# Patient Record
Sex: Female | Born: 1965
Health system: Southern US, Community
[De-identification: ages and names within clinical notes are randomized; demographics above are authoritative.]

## PROBLEM LIST (undated history)

## (undated) DIAGNOSIS — N3281 Overactive bladder: Secondary | ICD-10-CM

## (undated) DIAGNOSIS — K219 Gastro-esophageal reflux disease without esophagitis: Secondary | ICD-10-CM

## (undated) DIAGNOSIS — M1990 Unspecified osteoarthritis, unspecified site: Secondary | ICD-10-CM

## (undated) DIAGNOSIS — R519 Headache, unspecified: Secondary | ICD-10-CM

## (undated) DIAGNOSIS — D649 Anemia, unspecified: Secondary | ICD-10-CM

## (undated) HISTORY — PX: SPINE SURGERY: SHX786

## (undated) HISTORY — PX: CHOLECYSTECTOMY: SHX55

## (undated) HISTORY — DX: Gastro-esophageal reflux disease without esophagitis: K21.9

## (undated) HISTORY — PX: ABDOMINAL HYSTERECTOMY: SHX81

---
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
## 2001-09-15 HISTORY — PX: ABDOMINAL HYSTERECTOMY: SHX81

## 2001-09-15 HISTORY — PX: CHOLECYSTECTOMY: SHX55

## 2011-09-16 HISTORY — PX: OTHER SURGICAL HISTORY: SHX169

## 2013-09-15 HISTORY — PX: INCONTINENCE SURGERY: SHX676

## 2015-08-02 DIAGNOSIS — M5412 Radiculopathy, cervical region: Secondary | ICD-10-CM | POA: Insufficient documentation

## 2016-01-02 DIAGNOSIS — M722 Plantar fascial fibromatosis: Secondary | ICD-10-CM | POA: Diagnosis not present

## 2016-01-02 DIAGNOSIS — Z1389 Encounter for screening for other disorder: Secondary | ICD-10-CM | POA: Diagnosis not present

## 2016-01-02 DIAGNOSIS — Z6828 Body mass index (BMI) 28.0-28.9, adult: Secondary | ICD-10-CM | POA: Diagnosis not present

## 2016-01-18 DIAGNOSIS — Z6827 Body mass index (BMI) 27.0-27.9, adult: Secondary | ICD-10-CM | POA: Diagnosis not present

## 2016-01-18 DIAGNOSIS — L237 Allergic contact dermatitis due to plants, except food: Secondary | ICD-10-CM | POA: Diagnosis not present

## 2016-02-01 DIAGNOSIS — M1612 Unilateral primary osteoarthritis, left hip: Secondary | ICD-10-CM | POA: Diagnosis not present

## 2016-02-01 DIAGNOSIS — M25552 Pain in left hip: Secondary | ICD-10-CM | POA: Diagnosis not present

## 2016-02-01 DIAGNOSIS — Z6829 Body mass index (BMI) 29.0-29.9, adult: Secondary | ICD-10-CM | POA: Diagnosis not present

## 2016-02-01 DIAGNOSIS — M5417 Radiculopathy, lumbosacral region: Secondary | ICD-10-CM | POA: Diagnosis not present

## 2016-02-26 DIAGNOSIS — M1612 Unilateral primary osteoarthritis, left hip: Secondary | ICD-10-CM | POA: Diagnosis not present

## 2016-02-26 DIAGNOSIS — M4726 Other spondylosis with radiculopathy, lumbar region: Secondary | ICD-10-CM | POA: Diagnosis not present

## 2016-02-26 DIAGNOSIS — M5417 Radiculopathy, lumbosacral region: Secondary | ICD-10-CM | POA: Diagnosis not present

## 2016-02-26 DIAGNOSIS — Z6829 Body mass index (BMI) 29.0-29.9, adult: Secondary | ICD-10-CM | POA: Diagnosis not present

## 2016-02-26 DIAGNOSIS — M5416 Radiculopathy, lumbar region: Secondary | ICD-10-CM | POA: Insufficient documentation

## 2016-03-07 DIAGNOSIS — M25552 Pain in left hip: Secondary | ICD-10-CM | POA: Diagnosis not present

## 2016-03-13 DIAGNOSIS — M5417 Radiculopathy, lumbosacral region: Secondary | ICD-10-CM | POA: Diagnosis not present

## 2016-03-13 DIAGNOSIS — M7138 Other bursal cyst, other site: Secondary | ICD-10-CM | POA: Diagnosis not present

## 2016-03-13 DIAGNOSIS — M545 Low back pain: Secondary | ICD-10-CM | POA: Diagnosis not present

## 2016-03-13 DIAGNOSIS — D1809 Hemangioma of other sites: Secondary | ICD-10-CM | POA: Diagnosis not present

## 2016-03-13 DIAGNOSIS — M5116 Intervertebral disc disorders with radiculopathy, lumbar region: Secondary | ICD-10-CM | POA: Diagnosis not present

## 2016-03-25 DIAGNOSIS — M1612 Unilateral primary osteoarthritis, left hip: Secondary | ICD-10-CM | POA: Diagnosis not present

## 2016-03-25 DIAGNOSIS — M4726 Other spondylosis with radiculopathy, lumbar region: Secondary | ICD-10-CM | POA: Diagnosis not present

## 2016-03-25 DIAGNOSIS — M5417 Radiculopathy, lumbosacral region: Secondary | ICD-10-CM | POA: Diagnosis not present

## 2016-03-25 DIAGNOSIS — Z6829 Body mass index (BMI) 29.0-29.9, adult: Secondary | ICD-10-CM | POA: Diagnosis not present

## 2016-03-27 DIAGNOSIS — Z6829 Body mass index (BMI) 29.0-29.9, adult: Secondary | ICD-10-CM | POA: Diagnosis not present

## 2016-03-27 DIAGNOSIS — M1612 Unilateral primary osteoarthritis, left hip: Secondary | ICD-10-CM | POA: Diagnosis not present

## 2016-04-10 DIAGNOSIS — M4726 Other spondylosis with radiculopathy, lumbar region: Secondary | ICD-10-CM | POA: Diagnosis not present

## 2016-04-10 DIAGNOSIS — Z6829 Body mass index (BMI) 29.0-29.9, adult: Secondary | ICD-10-CM | POA: Diagnosis not present

## 2016-04-10 DIAGNOSIS — M5417 Radiculopathy, lumbosacral region: Secondary | ICD-10-CM | POA: Diagnosis not present

## 2016-04-10 DIAGNOSIS — M1612 Unilateral primary osteoarthritis, left hip: Secondary | ICD-10-CM | POA: Diagnosis not present

## 2016-04-25 DIAGNOSIS — M5412 Radiculopathy, cervical region: Secondary | ICD-10-CM | POA: Diagnosis not present

## 2016-04-25 DIAGNOSIS — M9901 Segmental and somatic dysfunction of cervical region: Secondary | ICD-10-CM | POA: Diagnosis not present

## 2016-04-25 DIAGNOSIS — R51 Headache: Secondary | ICD-10-CM | POA: Diagnosis not present

## 2016-04-28 DIAGNOSIS — M546 Pain in thoracic spine: Secondary | ICD-10-CM | POA: Diagnosis not present

## 2016-04-28 DIAGNOSIS — R51 Headache: Secondary | ICD-10-CM | POA: Diagnosis not present

## 2016-04-28 DIAGNOSIS — M5412 Radiculopathy, cervical region: Secondary | ICD-10-CM | POA: Diagnosis not present

## 2016-04-28 DIAGNOSIS — M9901 Segmental and somatic dysfunction of cervical region: Secondary | ICD-10-CM | POA: Diagnosis not present

## 2016-04-30 DIAGNOSIS — M546 Pain in thoracic spine: Secondary | ICD-10-CM | POA: Diagnosis not present

## 2016-04-30 DIAGNOSIS — M9901 Segmental and somatic dysfunction of cervical region: Secondary | ICD-10-CM | POA: Diagnosis not present

## 2016-04-30 DIAGNOSIS — R51 Headache: Secondary | ICD-10-CM | POA: Diagnosis not present

## 2016-05-02 DIAGNOSIS — R51 Headache: Secondary | ICD-10-CM | POA: Diagnosis not present

## 2016-05-02 DIAGNOSIS — M9901 Segmental and somatic dysfunction of cervical region: Secondary | ICD-10-CM | POA: Diagnosis not present

## 2016-05-02 DIAGNOSIS — M546 Pain in thoracic spine: Secondary | ICD-10-CM | POA: Diagnosis not present

## 2016-05-05 DIAGNOSIS — M9901 Segmental and somatic dysfunction of cervical region: Secondary | ICD-10-CM | POA: Diagnosis not present

## 2016-05-05 DIAGNOSIS — M546 Pain in thoracic spine: Secondary | ICD-10-CM | POA: Diagnosis not present

## 2016-05-05 DIAGNOSIS — M5412 Radiculopathy, cervical region: Secondary | ICD-10-CM | POA: Diagnosis not present

## 2016-05-05 DIAGNOSIS — R51 Headache: Secondary | ICD-10-CM | POA: Diagnosis not present

## 2016-05-07 DIAGNOSIS — M9901 Segmental and somatic dysfunction of cervical region: Secondary | ICD-10-CM | POA: Diagnosis not present

## 2016-05-07 DIAGNOSIS — M546 Pain in thoracic spine: Secondary | ICD-10-CM | POA: Diagnosis not present

## 2016-05-07 DIAGNOSIS — R51 Headache: Secondary | ICD-10-CM | POA: Diagnosis not present

## 2016-05-08 DIAGNOSIS — N390 Urinary tract infection, site not specified: Secondary | ICD-10-CM | POA: Diagnosis not present

## 2016-05-12 DIAGNOSIS — Z6829 Body mass index (BMI) 29.0-29.9, adult: Secondary | ICD-10-CM | POA: Diagnosis not present

## 2016-05-12 DIAGNOSIS — M62838 Other muscle spasm: Secondary | ICD-10-CM | POA: Diagnosis not present

## 2016-05-12 DIAGNOSIS — M4726 Other spondylosis with radiculopathy, lumbar region: Secondary | ICD-10-CM | POA: Diagnosis not present

## 2016-05-12 DIAGNOSIS — M5417 Radiculopathy, lumbosacral region: Secondary | ICD-10-CM | POA: Diagnosis not present

## 2016-05-14 DIAGNOSIS — M5412 Radiculopathy, cervical region: Secondary | ICD-10-CM | POA: Diagnosis not present

## 2016-05-14 DIAGNOSIS — M546 Pain in thoracic spine: Secondary | ICD-10-CM | POA: Diagnosis not present

## 2016-05-14 DIAGNOSIS — M9901 Segmental and somatic dysfunction of cervical region: Secondary | ICD-10-CM | POA: Diagnosis not present

## 2016-05-21 DIAGNOSIS — R32 Unspecified urinary incontinence: Secondary | ICD-10-CM | POA: Diagnosis not present

## 2016-05-21 DIAGNOSIS — M9901 Segmental and somatic dysfunction of cervical region: Secondary | ICD-10-CM | POA: Diagnosis not present

## 2016-05-21 DIAGNOSIS — M546 Pain in thoracic spine: Secondary | ICD-10-CM | POA: Diagnosis not present

## 2016-05-21 DIAGNOSIS — N3281 Overactive bladder: Secondary | ICD-10-CM | POA: Diagnosis not present

## 2016-05-23 DIAGNOSIS — M5412 Radiculopathy, cervical region: Secondary | ICD-10-CM | POA: Diagnosis not present

## 2016-05-23 DIAGNOSIS — M546 Pain in thoracic spine: Secondary | ICD-10-CM | POA: Diagnosis not present

## 2016-05-23 DIAGNOSIS — R51 Headache: Secondary | ICD-10-CM | POA: Diagnosis not present

## 2016-05-23 DIAGNOSIS — M9901 Segmental and somatic dysfunction of cervical region: Secondary | ICD-10-CM | POA: Diagnosis not present

## 2016-05-26 DIAGNOSIS — M546 Pain in thoracic spine: Secondary | ICD-10-CM | POA: Diagnosis not present

## 2016-05-26 DIAGNOSIS — M5412 Radiculopathy, cervical region: Secondary | ICD-10-CM | POA: Diagnosis not present

## 2016-05-26 DIAGNOSIS — M9901 Segmental and somatic dysfunction of cervical region: Secondary | ICD-10-CM | POA: Diagnosis not present

## 2016-05-26 DIAGNOSIS — R51 Headache: Secondary | ICD-10-CM | POA: Diagnosis not present

## 2016-05-28 DIAGNOSIS — R51 Headache: Secondary | ICD-10-CM | POA: Diagnosis not present

## 2016-05-28 DIAGNOSIS — M9901 Segmental and somatic dysfunction of cervical region: Secondary | ICD-10-CM | POA: Diagnosis not present

## 2016-05-28 DIAGNOSIS — M546 Pain in thoracic spine: Secondary | ICD-10-CM | POA: Diagnosis not present

## 2016-05-28 DIAGNOSIS — M5412 Radiculopathy, cervical region: Secondary | ICD-10-CM | POA: Diagnosis not present

## 2016-05-30 DIAGNOSIS — Z6828 Body mass index (BMI) 28.0-28.9, adult: Secondary | ICD-10-CM | POA: Diagnosis not present

## 2016-05-30 DIAGNOSIS — K219 Gastro-esophageal reflux disease without esophagitis: Secondary | ICD-10-CM | POA: Diagnosis not present

## 2016-05-30 DIAGNOSIS — L309 Dermatitis, unspecified: Secondary | ICD-10-CM | POA: Diagnosis not present

## 2016-05-30 DIAGNOSIS — H6593 Unspecified nonsuppurative otitis media, bilateral: Secondary | ICD-10-CM | POA: Diagnosis not present

## 2016-06-28 DIAGNOSIS — S20212A Contusion of left front wall of thorax, initial encounter: Secondary | ICD-10-CM | POA: Diagnosis not present

## 2016-06-28 DIAGNOSIS — S299XXA Unspecified injury of thorax, initial encounter: Secondary | ICD-10-CM | POA: Diagnosis not present

## 2016-06-28 DIAGNOSIS — M542 Cervicalgia: Secondary | ICD-10-CM | POA: Diagnosis not present

## 2016-06-28 DIAGNOSIS — S161XXA Strain of muscle, fascia and tendon at neck level, initial encounter: Secondary | ICD-10-CM | POA: Diagnosis not present

## 2016-07-01 DIAGNOSIS — K210 Gastro-esophageal reflux disease with esophagitis: Secondary | ICD-10-CM | POA: Diagnosis not present

## 2016-07-01 DIAGNOSIS — R1310 Dysphagia, unspecified: Secondary | ICD-10-CM | POA: Diagnosis not present

## 2016-07-02 DIAGNOSIS — K2900 Acute gastritis without bleeding: Secondary | ICD-10-CM | POA: Diagnosis not present

## 2016-07-02 DIAGNOSIS — K228 Other specified diseases of esophagus: Secondary | ICD-10-CM | POA: Diagnosis not present

## 2016-07-02 DIAGNOSIS — K2950 Unspecified chronic gastritis without bleeding: Secondary | ICD-10-CM | POA: Diagnosis not present

## 2016-07-02 DIAGNOSIS — R12 Heartburn: Secondary | ICD-10-CM | POA: Diagnosis not present

## 2016-07-02 DIAGNOSIS — K210 Gastro-esophageal reflux disease with esophagitis: Secondary | ICD-10-CM | POA: Diagnosis not present

## 2016-07-02 DIAGNOSIS — R1310 Dysphagia, unspecified: Secondary | ICD-10-CM | POA: Diagnosis not present

## 2016-07-16 DIAGNOSIS — L309 Dermatitis, unspecified: Secondary | ICD-10-CM | POA: Diagnosis not present

## 2016-07-16 DIAGNOSIS — Z6829 Body mass index (BMI) 29.0-29.9, adult: Secondary | ICD-10-CM | POA: Diagnosis not present

## 2016-07-16 DIAGNOSIS — M545 Low back pain: Secondary | ICD-10-CM | POA: Diagnosis not present

## 2017-10-23 DIAGNOSIS — Z6830 Body mass index (BMI) 30.0-30.9, adult: Secondary | ICD-10-CM | POA: Diagnosis not present

## 2017-10-23 DIAGNOSIS — K219 Gastro-esophageal reflux disease without esophagitis: Secondary | ICD-10-CM | POA: Diagnosis not present

## 2017-10-23 DIAGNOSIS — J029 Acute pharyngitis, unspecified: Secondary | ICD-10-CM | POA: Diagnosis not present

## 2017-11-03 DIAGNOSIS — R32 Unspecified urinary incontinence: Secondary | ICD-10-CM | POA: Diagnosis not present

## 2017-11-03 DIAGNOSIS — N3281 Overactive bladder: Secondary | ICD-10-CM | POA: Diagnosis not present

## 2017-11-20 DIAGNOSIS — M722 Plantar fascial fibromatosis: Secondary | ICD-10-CM | POA: Diagnosis not present

## 2017-12-11 DIAGNOSIS — J3089 Other allergic rhinitis: Secondary | ICD-10-CM | POA: Diagnosis not present

## 2017-12-11 DIAGNOSIS — J3081 Allergic rhinitis due to animal (cat) (dog) hair and dander: Secondary | ICD-10-CM | POA: Diagnosis not present

## 2017-12-11 DIAGNOSIS — Z131 Encounter for screening for diabetes mellitus: Secondary | ICD-10-CM | POA: Diagnosis not present

## 2017-12-11 DIAGNOSIS — Z13220 Encounter for screening for lipoid disorders: Secondary | ICD-10-CM | POA: Diagnosis not present

## 2017-12-11 DIAGNOSIS — Z0000 Encounter for general adult medical examination without abnormal findings: Secondary | ICD-10-CM | POA: Diagnosis not present

## 2017-12-11 DIAGNOSIS — J301 Allergic rhinitis due to pollen: Secondary | ICD-10-CM | POA: Diagnosis not present

## 2017-12-11 DIAGNOSIS — Z1211 Encounter for screening for malignant neoplasm of colon: Secondary | ICD-10-CM | POA: Diagnosis not present

## 2017-12-18 DIAGNOSIS — N3281 Overactive bladder: Secondary | ICD-10-CM | POA: Diagnosis not present

## 2017-12-18 DIAGNOSIS — Z1231 Encounter for screening mammogram for malignant neoplasm of breast: Secondary | ICD-10-CM | POA: Diagnosis not present

## 2017-12-18 DIAGNOSIS — R32 Unspecified urinary incontinence: Secondary | ICD-10-CM | POA: Diagnosis not present

## 2018-01-08 DIAGNOSIS — Z6830 Body mass index (BMI) 30.0-30.9, adult: Secondary | ICD-10-CM | POA: Diagnosis not present

## 2018-01-08 DIAGNOSIS — R0602 Shortness of breath: Secondary | ICD-10-CM | POA: Diagnosis not present

## 2018-01-08 DIAGNOSIS — E785 Hyperlipidemia, unspecified: Secondary | ICD-10-CM | POA: Diagnosis not present

## 2018-01-08 DIAGNOSIS — R7301 Impaired fasting glucose: Secondary | ICD-10-CM | POA: Diagnosis not present

## 2018-01-12 DIAGNOSIS — R0602 Shortness of breath: Secondary | ICD-10-CM | POA: Diagnosis not present

## 2018-01-12 DIAGNOSIS — R05 Cough: Secondary | ICD-10-CM | POA: Diagnosis not present

## 2018-01-15 DIAGNOSIS — R32 Unspecified urinary incontinence: Secondary | ICD-10-CM | POA: Diagnosis not present

## 2018-01-15 DIAGNOSIS — N3281 Overactive bladder: Secondary | ICD-10-CM | POA: Diagnosis not present

## 2018-01-19 DIAGNOSIS — M722 Plantar fascial fibromatosis: Secondary | ICD-10-CM | POA: Diagnosis not present

## 2018-01-19 DIAGNOSIS — M25572 Pain in left ankle and joints of left foot: Secondary | ICD-10-CM | POA: Diagnosis not present

## 2018-01-22 DIAGNOSIS — Z6830 Body mass index (BMI) 30.0-30.9, adult: Secondary | ICD-10-CM | POA: Diagnosis not present

## 2018-01-22 DIAGNOSIS — Z7189 Other specified counseling: Secondary | ICD-10-CM | POA: Diagnosis not present

## 2018-01-22 DIAGNOSIS — Z1331 Encounter for screening for depression: Secondary | ICD-10-CM | POA: Diagnosis not present

## 2018-01-22 DIAGNOSIS — R0602 Shortness of breath: Secondary | ICD-10-CM | POA: Diagnosis not present

## 2018-01-29 DIAGNOSIS — M722 Plantar fascial fibromatosis: Secondary | ICD-10-CM | POA: Diagnosis not present

## 2018-01-29 DIAGNOSIS — M25572 Pain in left ankle and joints of left foot: Secondary | ICD-10-CM | POA: Diagnosis not present

## 2018-02-05 DIAGNOSIS — Z6830 Body mass index (BMI) 30.0-30.9, adult: Secondary | ICD-10-CM | POA: Diagnosis not present

## 2018-02-05 DIAGNOSIS — R0602 Shortness of breath: Secondary | ICD-10-CM | POA: Diagnosis not present

## 2018-02-10 DIAGNOSIS — M50323 Other cervical disc degeneration at C6-C7 level: Secondary | ICD-10-CM | POA: Diagnosis not present

## 2018-02-10 DIAGNOSIS — M546 Pain in thoracic spine: Secondary | ICD-10-CM | POA: Diagnosis not present

## 2018-02-10 DIAGNOSIS — M5441 Lumbago with sciatica, right side: Secondary | ICD-10-CM | POA: Diagnosis not present

## 2018-02-10 DIAGNOSIS — M5013 Cervical disc disorder with radiculopathy, cervicothoracic region: Secondary | ICD-10-CM | POA: Diagnosis not present

## 2018-02-15 DIAGNOSIS — M50323 Other cervical disc degeneration at C6-C7 level: Secondary | ICD-10-CM | POA: Diagnosis not present

## 2018-02-15 DIAGNOSIS — M5013 Cervical disc disorder with radiculopathy, cervicothoracic region: Secondary | ICD-10-CM | POA: Diagnosis not present

## 2018-02-15 DIAGNOSIS — M546 Pain in thoracic spine: Secondary | ICD-10-CM | POA: Diagnosis not present

## 2018-02-15 DIAGNOSIS — M5441 Lumbago with sciatica, right side: Secondary | ICD-10-CM | POA: Diagnosis not present

## 2018-02-22 DIAGNOSIS — M546 Pain in thoracic spine: Secondary | ICD-10-CM | POA: Diagnosis not present

## 2018-02-22 DIAGNOSIS — M5013 Cervical disc disorder with radiculopathy, cervicothoracic region: Secondary | ICD-10-CM | POA: Diagnosis not present

## 2018-02-22 DIAGNOSIS — M50323 Other cervical disc degeneration at C6-C7 level: Secondary | ICD-10-CM | POA: Diagnosis not present

## 2018-02-22 DIAGNOSIS — M5441 Lumbago with sciatica, right side: Secondary | ICD-10-CM | POA: Diagnosis not present

## 2018-02-24 DIAGNOSIS — M546 Pain in thoracic spine: Secondary | ICD-10-CM | POA: Diagnosis not present

## 2018-02-24 DIAGNOSIS — M5441 Lumbago with sciatica, right side: Secondary | ICD-10-CM | POA: Diagnosis not present

## 2018-02-24 DIAGNOSIS — M50323 Other cervical disc degeneration at C6-C7 level: Secondary | ICD-10-CM | POA: Diagnosis not present

## 2018-02-24 DIAGNOSIS — M5013 Cervical disc disorder with radiculopathy, cervicothoracic region: Secondary | ICD-10-CM | POA: Diagnosis not present

## 2018-02-25 DIAGNOSIS — M50323 Other cervical disc degeneration at C6-C7 level: Secondary | ICD-10-CM | POA: Diagnosis not present

## 2018-02-25 DIAGNOSIS — M5013 Cervical disc disorder with radiculopathy, cervicothoracic region: Secondary | ICD-10-CM | POA: Diagnosis not present

## 2018-02-25 DIAGNOSIS — M546 Pain in thoracic spine: Secondary | ICD-10-CM | POA: Diagnosis not present

## 2018-02-25 DIAGNOSIS — M5441 Lumbago with sciatica, right side: Secondary | ICD-10-CM | POA: Diagnosis not present

## 2018-03-03 DIAGNOSIS — M546 Pain in thoracic spine: Secondary | ICD-10-CM | POA: Diagnosis not present

## 2018-03-03 DIAGNOSIS — M5441 Lumbago with sciatica, right side: Secondary | ICD-10-CM | POA: Diagnosis not present

## 2018-03-03 DIAGNOSIS — M5013 Cervical disc disorder with radiculopathy, cervicothoracic region: Secondary | ICD-10-CM | POA: Diagnosis not present

## 2018-03-03 DIAGNOSIS — M50323 Other cervical disc degeneration at C6-C7 level: Secondary | ICD-10-CM | POA: Diagnosis not present

## 2018-03-08 DIAGNOSIS — M546 Pain in thoracic spine: Secondary | ICD-10-CM | POA: Diagnosis not present

## 2018-03-08 DIAGNOSIS — M5441 Lumbago with sciatica, right side: Secondary | ICD-10-CM | POA: Diagnosis not present

## 2018-03-08 DIAGNOSIS — M50323 Other cervical disc degeneration at C6-C7 level: Secondary | ICD-10-CM | POA: Diagnosis not present

## 2018-03-08 DIAGNOSIS — M5013 Cervical disc disorder with radiculopathy, cervicothoracic region: Secondary | ICD-10-CM | POA: Diagnosis not present

## 2018-03-10 DIAGNOSIS — M546 Pain in thoracic spine: Secondary | ICD-10-CM | POA: Diagnosis not present

## 2018-03-10 DIAGNOSIS — M50323 Other cervical disc degeneration at C6-C7 level: Secondary | ICD-10-CM | POA: Diagnosis not present

## 2018-03-10 DIAGNOSIS — M5013 Cervical disc disorder with radiculopathy, cervicothoracic region: Secondary | ICD-10-CM | POA: Diagnosis not present

## 2018-03-10 DIAGNOSIS — M5441 Lumbago with sciatica, right side: Secondary | ICD-10-CM | POA: Diagnosis not present

## 2018-03-11 DIAGNOSIS — M546 Pain in thoracic spine: Secondary | ICD-10-CM | POA: Diagnosis not present

## 2018-03-11 DIAGNOSIS — M5441 Lumbago with sciatica, right side: Secondary | ICD-10-CM | POA: Diagnosis not present

## 2018-03-11 DIAGNOSIS — M5013 Cervical disc disorder with radiculopathy, cervicothoracic region: Secondary | ICD-10-CM | POA: Diagnosis not present

## 2018-03-11 DIAGNOSIS — M50323 Other cervical disc degeneration at C6-C7 level: Secondary | ICD-10-CM | POA: Diagnosis not present

## 2018-03-15 DIAGNOSIS — M50323 Other cervical disc degeneration at C6-C7 level: Secondary | ICD-10-CM | POA: Diagnosis not present

## 2018-03-15 DIAGNOSIS — M5441 Lumbago with sciatica, right side: Secondary | ICD-10-CM | POA: Diagnosis not present

## 2018-03-15 DIAGNOSIS — M546 Pain in thoracic spine: Secondary | ICD-10-CM | POA: Diagnosis not present

## 2018-03-15 DIAGNOSIS — M5013 Cervical disc disorder with radiculopathy, cervicothoracic region: Secondary | ICD-10-CM | POA: Diagnosis not present

## 2018-03-16 DIAGNOSIS — M5013 Cervical disc disorder with radiculopathy, cervicothoracic region: Secondary | ICD-10-CM | POA: Diagnosis not present

## 2018-03-16 DIAGNOSIS — M5441 Lumbago with sciatica, right side: Secondary | ICD-10-CM | POA: Diagnosis not present

## 2018-03-16 DIAGNOSIS — M50323 Other cervical disc degeneration at C6-C7 level: Secondary | ICD-10-CM | POA: Diagnosis not present

## 2018-03-16 DIAGNOSIS — M546 Pain in thoracic spine: Secondary | ICD-10-CM | POA: Diagnosis not present

## 2018-04-19 DIAGNOSIS — M545 Low back pain: Secondary | ICD-10-CM | POA: Diagnosis not present

## 2018-04-19 DIAGNOSIS — Z7952 Long term (current) use of systemic steroids: Secondary | ICD-10-CM | POA: Diagnosis not present

## 2018-04-19 DIAGNOSIS — Z79899 Other long term (current) drug therapy: Secondary | ICD-10-CM | POA: Diagnosis not present

## 2018-04-19 DIAGNOSIS — M549 Dorsalgia, unspecified: Secondary | ICD-10-CM | POA: Diagnosis not present

## 2018-04-19 DIAGNOSIS — M4696 Unspecified inflammatory spondylopathy, lumbar region: Secondary | ICD-10-CM | POA: Diagnosis not present

## 2018-04-19 DIAGNOSIS — M47816 Spondylosis without myelopathy or radiculopathy, lumbar region: Secondary | ICD-10-CM | POA: Diagnosis not present

## 2018-04-19 DIAGNOSIS — R102 Pelvic and perineal pain: Secondary | ICD-10-CM | POA: Diagnosis not present

## 2018-04-19 DIAGNOSIS — Z79891 Long term (current) use of opiate analgesic: Secondary | ICD-10-CM | POA: Diagnosis not present

## 2018-05-06 DIAGNOSIS — M545 Low back pain: Secondary | ICD-10-CM | POA: Diagnosis not present

## 2018-05-12 DIAGNOSIS — M160 Bilateral primary osteoarthritis of hip: Secondary | ICD-10-CM | POA: Diagnosis not present

## 2018-05-12 DIAGNOSIS — M25552 Pain in left hip: Secondary | ICD-10-CM | POA: Diagnosis not present

## 2018-05-13 DIAGNOSIS — M545 Low back pain: Secondary | ICD-10-CM | POA: Diagnosis not present

## 2018-05-21 DIAGNOSIS — M1612 Unilateral primary osteoarthritis, left hip: Secondary | ICD-10-CM | POA: Diagnosis not present

## 2018-05-26 DIAGNOSIS — N3281 Overactive bladder: Secondary | ICD-10-CM | POA: Diagnosis not present

## 2018-09-13 DIAGNOSIS — N390 Urinary tract infection, site not specified: Secondary | ICD-10-CM | POA: Diagnosis not present

## 2018-09-13 DIAGNOSIS — N3281 Overactive bladder: Secondary | ICD-10-CM | POA: Diagnosis not present

## 2019-12-14 ENCOUNTER — Encounter: Payer: Self-pay | Admitting: Physical Medicine and Rehabilitation

## 2019-12-30 ENCOUNTER — Encounter: Payer: 59 | Attending: Physical Medicine and Rehabilitation | Admitting: Physical Medicine and Rehabilitation

## 2019-12-30 ENCOUNTER — Other Ambulatory Visit: Payer: Self-pay

## 2019-12-30 ENCOUNTER — Encounter: Payer: Self-pay | Admitting: Physical Medicine and Rehabilitation

## 2019-12-30 VITALS — BP 149/84 | HR 66 | Temp 97.7°F | Ht 60.0 in | Wt 161.0 lb

## 2019-12-30 DIAGNOSIS — M25551 Pain in right hip: Secondary | ICD-10-CM | POA: Diagnosis not present

## 2019-12-30 DIAGNOSIS — M79672 Pain in left foot: Secondary | ICD-10-CM | POA: Diagnosis not present

## 2019-12-30 DIAGNOSIS — M1652 Unilateral post-traumatic osteoarthritis, left hip: Secondary | ICD-10-CM

## 2019-12-30 DIAGNOSIS — M5416 Radiculopathy, lumbar region: Secondary | ICD-10-CM | POA: Insufficient documentation

## 2019-12-30 MED ORDER — DIAZEPAM 5 MG PO TABS: 5.0000 mg | ORAL_TABLET | Freq: Once | ORAL | 0 refills | Status: AC

## 2019-12-30 MED ORDER — DULOXETINE HCL 30 MG PO CPEP: 30.0000 mg | ORAL_CAPSULE | Freq: Every day | ORAL | 3 refills | Status: DC

## 2019-12-30 NOTE — Patient Instructions (Signed)
1. Need to get xray of L hip  From what pt says, is bone on bone and needs THR.   2. Podiatry referral for L foot pain  3. MRI of lumbar spine due to lumbar radiculopathy and urinary incontinence  4. R hip MRI since has gotten xray at OSH and undergone R trochanteric bursa injection with no improvement- checking on labrum and cartilage.  5. Valium 5 mg x1 for MRI can repeat x1- #2 given . 6.  Duloxetine /Cymbalta 30 mg nightly x 1 week  Then 60 mg nightly- for nerve pain  1% of patients can have nausea with Duloxetine- call me if needs an anti-nausea medicine. Can also cause mild dry mouth/dry eyes and mild constipation.  7. Will look into pain meds once gets results of MRIs, xray back.   8. Once results come back- will call pt to go over all results. And go ver options for pain control, etc.    9. Tennis balls- 2-4 minutes- firm pressure against a harder surface- no massage- use as frequently as daily- also throw 1 in car.   10. F/U in 4-6 weeks.

## 2019-12-30 NOTE — Progress Notes (Signed)
Subjective:    Patient ID: Donna [name], female    DOB: Dec 02, 1965, 54 y.o.   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    The "strongest" medicine has received is ibuprofen 800 mg -   Pain medicine makes her feel "sick" /nauseated, so takes at night.    Around xmas- did "splits" and went down and fell on R hip.  Got trochanteric bursa injection last week. On 4/7- didn't help at all- not even for a short period.    Was given Lortab, Percocet and Loricet- mainly helping her fall asleep, but was still having pain when woke up. In remote past.    Wakes up every 1-2 hours- because can lay on 1 hip for 1 hour at a time.  If lays on back, hurt really bad when wakes up. Uses pole from bed to pull herself into sitting position or to turn over.  Did xrays at their office.  Last week.  Had MRI of L hip 2 years ago. Was told needed L hip replacement.    Back pain- burning, sharp pain in low back- in band across low back Radiates down R leg- sometimes to the feet.   L knee tries to "lock up on her"- and thinks all pain is related- the hips, back and L knee.   Last fall was 12/25- raining and muddy- and leg went out from her. Not since then.   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  Puts more weight on R hip and RLE due to L hip problems Vacuuming, sweeping, toting stuff, bending over to lift.  Washing dishes also makes it worse.  Bending forward is much worse than back extension   Tried: Never tried nerve pain meds Tried muscle relaxants- not helpful.  Steroids- have tried short term- steroid taper;  didn't help back pain    L foot goes "out" and "sinks" in- medial aspect of L foot    Social Hx:  Husband passed away in 01/17/18. 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  Tried to get disability- was turned down- tried 2020 for the last time.        Pain Inventory Average Pain 9 Pain Right Now 9 My pain is sharp, burning and aching  In the last 24 hours, has pain interfered with the following? General activity 6 Relation with others 7 Enjoyment of life 7 What TIME of day is your pain at its worst? all Sleep (in general) Poor  Pain is worse with: walking, bending, sitting and standing Pain improves with: none Relief from Meds: 0  Mobility walk without assistance do you drive?  yes Do you have any goals in this area?  yes  Function employed # of hrs/week 40  Neuro/Psych bladder control problems  Prior Studies new  Physicians involved in your care new   Family History  Problem Relation Age of Onset  . Heart disease Mother   . Cancer Father   . Heart disease Father    Social History   Socioeconomic History  . Marital status: Unknown    Spouse name: Not on file  . Number of children: Not on file  . Years of education: Not on file  . Highest education level: Not  on file  Occupational History  . Not on file  Tobacco Use  . Smoking status: Former Research scientist (life sciences)  . Smokeless tobacco: Never Used  Substance and Sexual Activity  . Alcohol use: Not on file  . Drug use: Not on file  . Sexual activity: Not on file  Other Topics Concern  . Not on file  Social History Narrative  . Not on file   Social Determinants of Health   Financial Resource Strain:   . Difficulty of Paying Living Expenses:   Food Insecurity:   . Worried About Charity fundraiser in the Last Year:   . Arboriculturist in the Last Year:   Transportation Needs:   . Film/video editor (Medical):   Marland Kitchen Lack of Transportation (Non-Medical):   Physical Activity:   . Days of Exercise per Week:   . Minutes of Exercise per Session:   Stress:   . Feeling of Stress :   Social Connections:   . Frequency of Communication with Friends and Family:    . Frequency of Social Gatherings with Friends and Family:   . Attends Religious Services:   . Active Member of Clubs or Organizations:   . Attends Archivist Meetings:   Marland Kitchen Marital Status:     Past Medical History:  Diagnosis Date  . GERD (gastroesophageal reflux disease)    BP (!) 149/84   Pulse 66   Temp 97.7 F (36.5 C)   Ht 5' (1.524 m)   Wt 161 lb (73 kg)   SpO2 96%   BMI 31.44 kg/m   Opioid Risk Score:   Fall Risk Score:  `1  Depression screen PHQ 2/9  Depression screen PHQ 2/9 12/30/2019  Decreased Interest 1  Down, Depressed, Hopeless 0  PHQ - 2 Score 1  Altered sleeping 3  Tired, decreased energy 1  Change in appetite 0  Feeling bad or failure about yourself  0  Trouble concentrating 0  Moving slowly or fidgety/restless 2  Suicidal thoughts 0  PHQ-9 Score 7    Review of Systems  Constitutional: Negative.   HENT: Negative.   Respiratory: Negative.   Cardiovascular: Negative.   Gastrointestinal: Negative.   Genitourinary: Positive for difficulty urinating.  Musculoskeletal: Positive for arthralgias and back pain.  Skin: Negative.   Allergic/Immunologic: Negative.   Neurological: Negative.   Psychiatric/Behavioral: Negative.   All other systems reviewed and are negative.      Objective:   Physical Exam Awake, alert, appropriate,  Sitting on table, NAD MS:  UEs 5/5 in deltoid, biceps, triceps, WE, grip and finger abd B/L HF, KE, KF, DF and PF 5/5 in LEs NO groin pain/TTP on R- only L TTP over R lateral trochanteric bursa and also as well on L lateral trochanteric bursa as well.  Lumbar extension/rotation doesn't cause pain on R or L Lumbar flexion causes increased lumbar pain TTP over midline lower lumbar as well as lumbar paraspinals B/L   Neuro: Intact to light touch  In UEs and LEs B/L- on every dermatome   L foot medial aspect of foot is curved inwards, not "straight"-     these showed degenerative disc disease at L5-S1  with spondylosis. 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   1. Need to get xray of L hip  From what pt says, is bone on  bone and needs THR.   2. Podiatry referral for L foot pain  3. MRI of lumbar spine due to lumbar radiculopathy and urinary incontinence  4. R hip MRI since has gotten xray at OSH and undergone R trochanteric bursa injection with no improvement- checking on labrum and cartilage.  5. Valium 5 mg x1 for MRI can repeat x1- #2 given . 6.  Duloxetine /Cymbalta 30 mg nightly x 1 week  Then 60 mg nightly- for nerve pain  1% of patients can have nausea with Duloxetine- call me if needs an anti-nausea medicine. Can also cause mild dry mouth/dry eyes and mild constipation.  7. Will look into pain meds once gets results of MRIs, xray back.   8. Once results come back- will call pt to go over all results. And go ver options for pain control, etc.    9. Tennis balls- 2-4 minutes- firm pressure against a harder surface- no massage- use as frequently as daily- also throw 1 in car.   10. F/U in 4-6 weeks.   I spent a total of 50 minutes on appointment- more than 30 minutes discussing options as detailed above.

## 2020-01-02 ENCOUNTER — Other Ambulatory Visit (HOSPITAL_BASED_OUTPATIENT_CLINIC_OR_DEPARTMENT_OTHER): Payer: Self-pay | Admitting: Physical Medicine and Rehabilitation

## 2020-01-03 ENCOUNTER — Telehealth: Payer: Self-pay | Admitting: *Deleted

## 2020-01-03 NOTE — Telephone Encounter (Signed)
Donna [name] left a message on clinical [name] "medication is duloxetine" but did not say what this was inrefernce to. I have called her back and asked for a call back with detail of what she was trying to say.

## 2020-01-05 NOTE — Telephone Encounter (Signed)
I don't understand what's being asked for here- please let me know. thanks

## 2020-01-06 NOTE — Telephone Encounter (Signed)
Got real nauseated from Duloxetine and also "got a headache" from it as well.  Asked her to not take anymore and will con't current regimen otherwise until get MRI results back.

## 2020-01-07 ENCOUNTER — Ambulatory Visit (HOSPITAL_BASED_OUTPATIENT_CLINIC_OR_DEPARTMENT_OTHER)
Admission: RE | Admit: 2020-01-07 | Discharge: 2020-01-07 | Disposition: A | Discharge status: Home / Self Care | Payer: 59 | Source: Ambulatory Visit | Attending: Physical Medicine and Rehabilitation | Admitting: Physical Medicine and Rehabilitation

## 2020-01-07 ENCOUNTER — Other Ambulatory Visit: Payer: Self-pay

## 2020-01-07 DIAGNOSIS — M1652 Unilateral post-traumatic osteoarthritis, left hip: Secondary | ICD-10-CM | POA: Insufficient documentation

## 2020-01-07 DIAGNOSIS — M5416 Radiculopathy, lumbar region: Secondary | ICD-10-CM | POA: Diagnosis present

## 2020-01-07 DIAGNOSIS — M25551 Pain in right hip: Secondary | ICD-10-CM | POA: Insufficient documentation

## 2020-01-07 IMAGING — MR MR HIP*R* W/O CM
7 series · 40 of 40 positions shown · non-contrast
Comparison: Bilateral hip x-rays dated [DATE]. MRI left hip
dated [DATE].

CLINICAL DATA: Persistent right hip pain since fall last [REDACTED].
No prior surgery.

EXAM:
MR OF THE RIGHT HIP WITHOUT CONTRAST
TECHNIQUE: Multiplanar, multisequence MR imaging was performed. No intravenous
contrast was administered.

[Series 6: T1 · coronal · 4.0mm · 1.33mm/px · 6 of 34 slices shown]
[im 1/34]
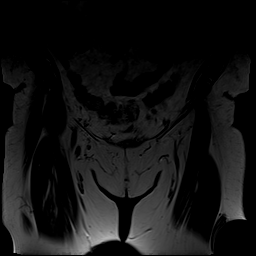
[im 7/34]
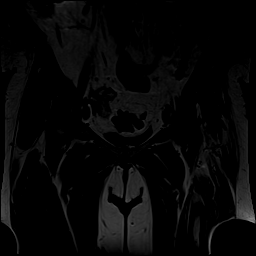
[im 14/34]
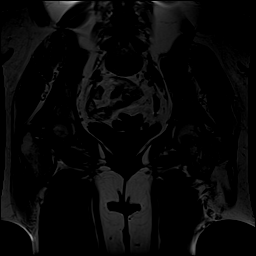
[im 20/34]
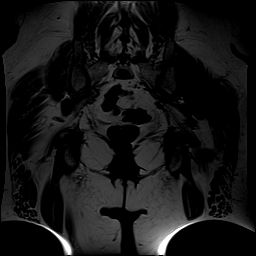
[im 27/34]
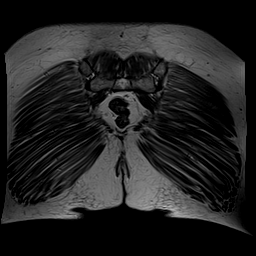
[im 34/34]
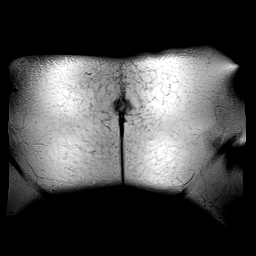

[Series 7: STIR · coronal · 4.0mm · 1.33mm/px · 7 of 34 slices shown]
[im 1/34]
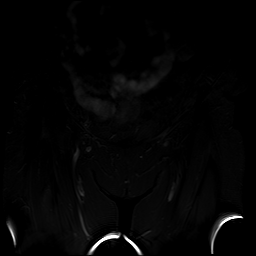
[im 6/34]
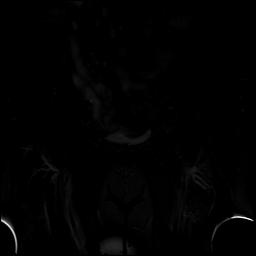
[im 12/34]
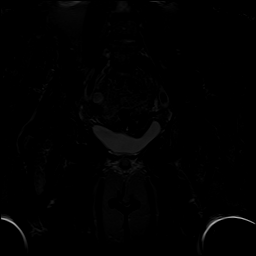
[im 17/34]
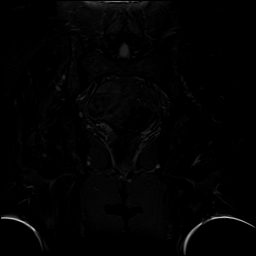
[im 23/34]
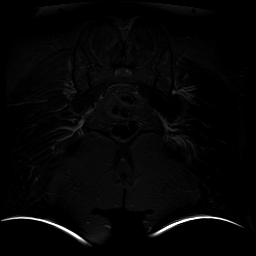
[im 28/34]
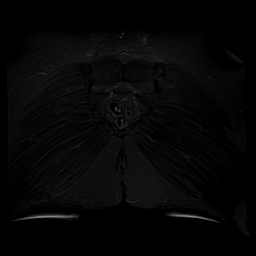
[im 34/34]
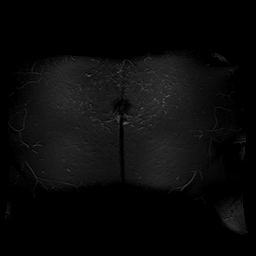

[Series 8: T2 fat-sat · axial · 4.0mm · 0.70mm/px · z∈[-213,-88]mm · 6 of 26 slices shown]
[im 1/26]
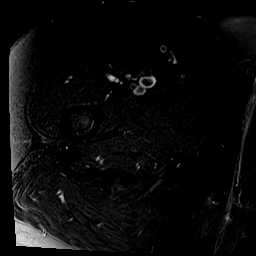
[im 6/26]
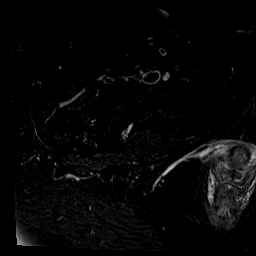
[im 11/26]
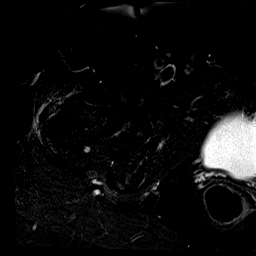
[im 16/26]
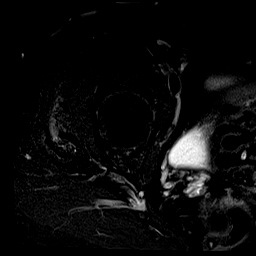
[im 21/26]
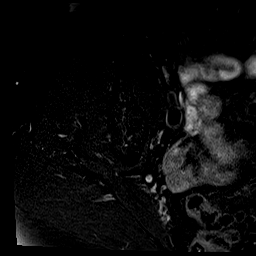
[im 26/26]
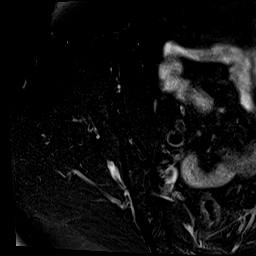

[Series 9: PD fat-sat · sagittal · 4.0mm · 0.70mm/px · 6 of 26 slices shown (1 of 3)]
[im 1/26]
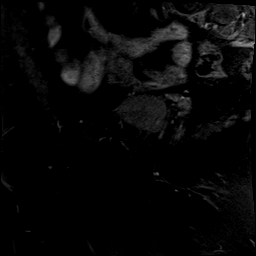
[im 6/26]
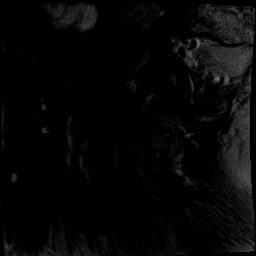
[im 11/26]
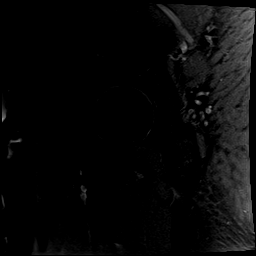
[im 16/26]
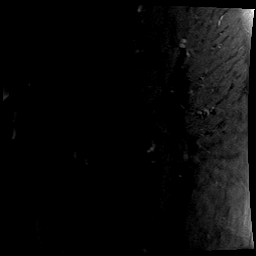
[im 21/26]
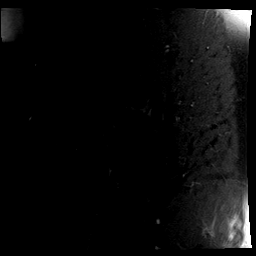
[im 26/26]
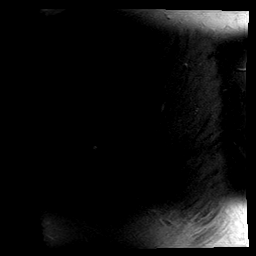

[Series 10: PD fat-sat · coronal · 4.0mm · 0.70mm/px · 5 of 22 slices shown (2 of 3)]
[im 1/22]
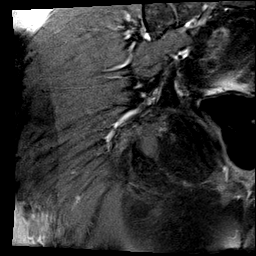
[im 6/22]
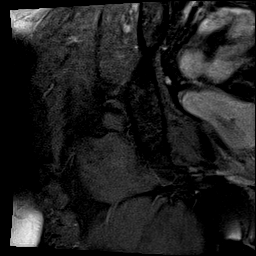
[im 11/22]
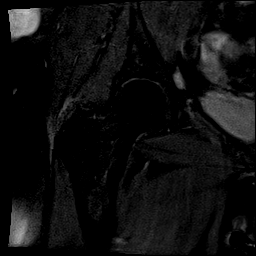
[im 16/22]
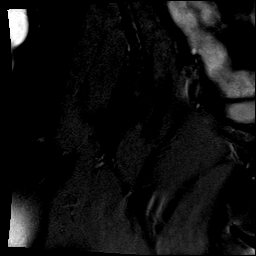
[im 22/22]
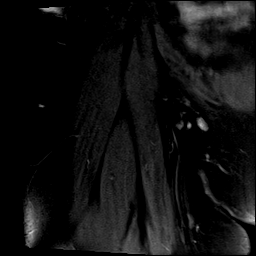

[Series 11: PD fat-sat · coronal · 4.0mm · 0.70mm/px · 5 of 22 slices shown (3 of 3)]
[im 1/22]
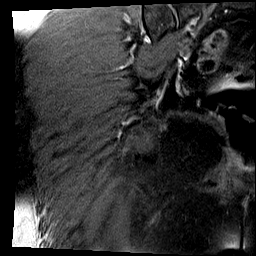
[im 6/22]
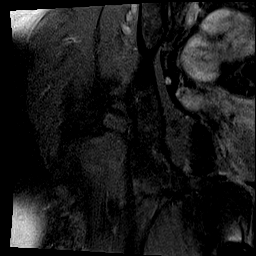
[im 11/22]
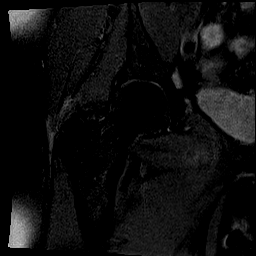
[im 16/22]
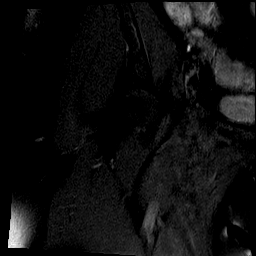
[im 22/22]
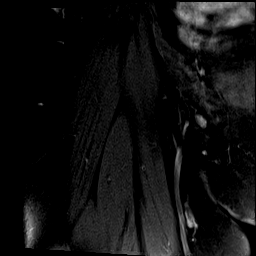

[Series 101: hx rt hip · axial · 10.0mm · 0.78mm/px · z∈[-236,+92]mm · 5 of 21 slices shown]
[im 1/21]
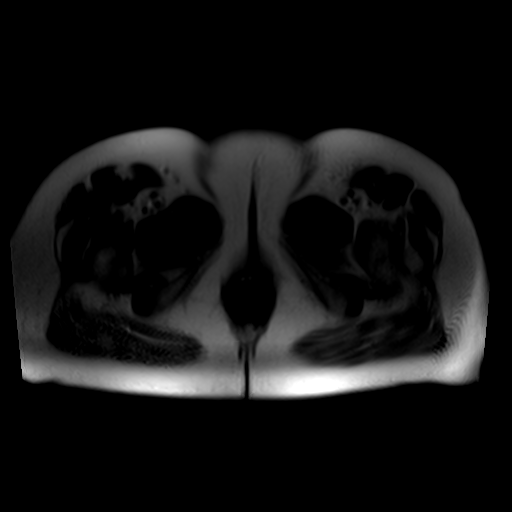
[im 6/21]
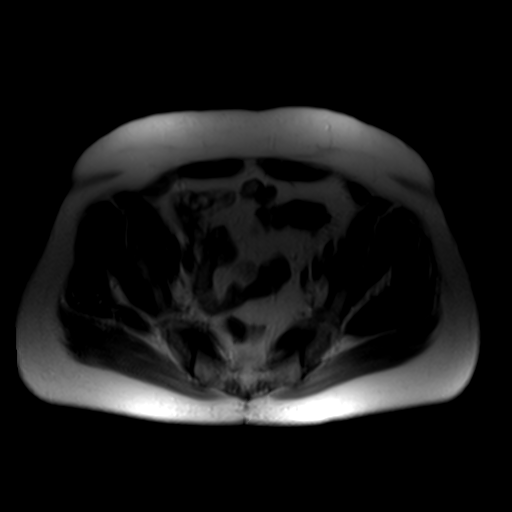
[im 11/21]
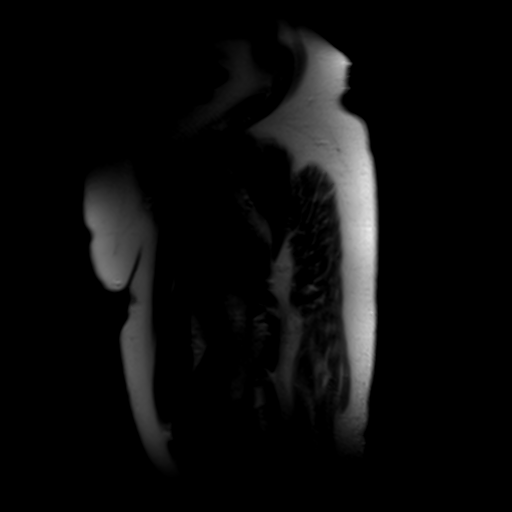
[im 16/21]
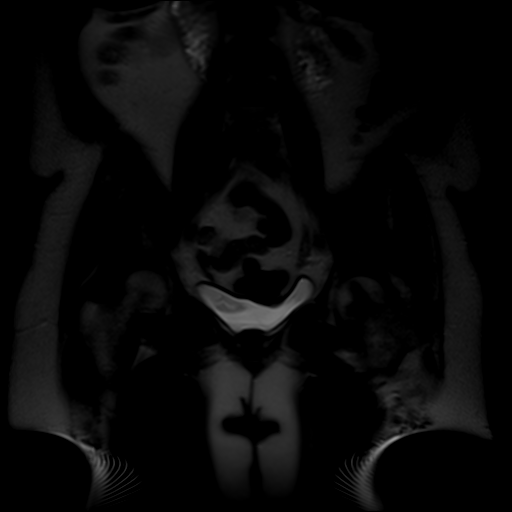
[im 21/21]
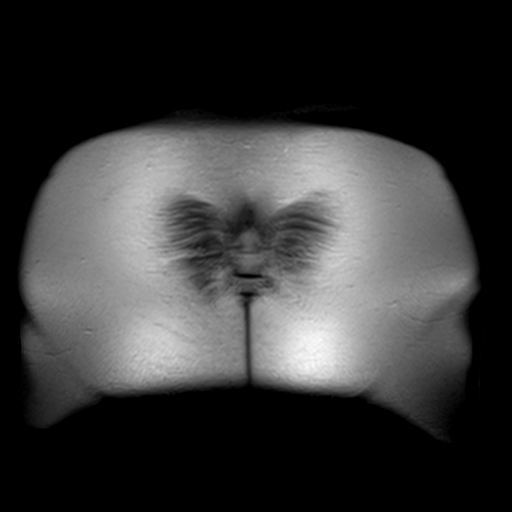

[40 of 40 positions shown; findings below may reference images not displayed]

FINDINGS: Bones: There is no evidence of acute fracture, dislocation or
avascular necrosis. No focal bone lesion. Chronic deformity and
postsurgical changes of the left intertrochanteric femur. The
visualized sacroiliac joints and symphysis pubis appear normal.

Articular cartilage and labrum

Articular cartilage: Unchanged mild diffuse cartilage thinning of
both hip joints without focal defect. Unchanged small bilateral hip
marginal osteophytes. No subchondral signal abnormality identified.

Labrum: Grossly intact, although evaluation is limited due to lack
of intra-articular fluid. No paralabral abnormality.

Joint or bursal effusion

Joint effusion: No significant hip joint effusion.

Bursae: No focal periarticular fluid collection.

Muscles and tendons

Muscles and tendons: Right gluteus minimus and medius tendinosis
with tiny partial tear of the gluteus medius (series 8, image 13).
Associated peritendinous edema. The visualized hamstring, iliopsoas,
and left gluteus tendons appear normal. No muscle edema. Severe
atrophy of the left proximal vastus lateralis muscle again noted.

Other findings

Miscellaneous: Prior hysterectomy. Sigmoid diverticulosis.
IMPRESSION: 1. No acute abnormality.
2. Right gluteus minimus and medius tendinosis with tiny partial
tear of the gluteus medius.
3. Unchanged mild bilateral hip osteoarthritis.
4. Chronic deformity and postsurgical changes of the left
intertrochanteric femur.

## 2020-01-07 IMAGING — DX DG HIP (WITH OR WITHOUT PELVIS) 3-4V BILAT
5 series · 5 of 5 positions shown · non-contrast
Comparison: [DATE]

CLINICAL DATA: Left hip pain, osteoarthritis

EXAM:
DG HIP (WITH OR WITHOUT PELVIS) 3-4V BILAT

[pelvis ap]
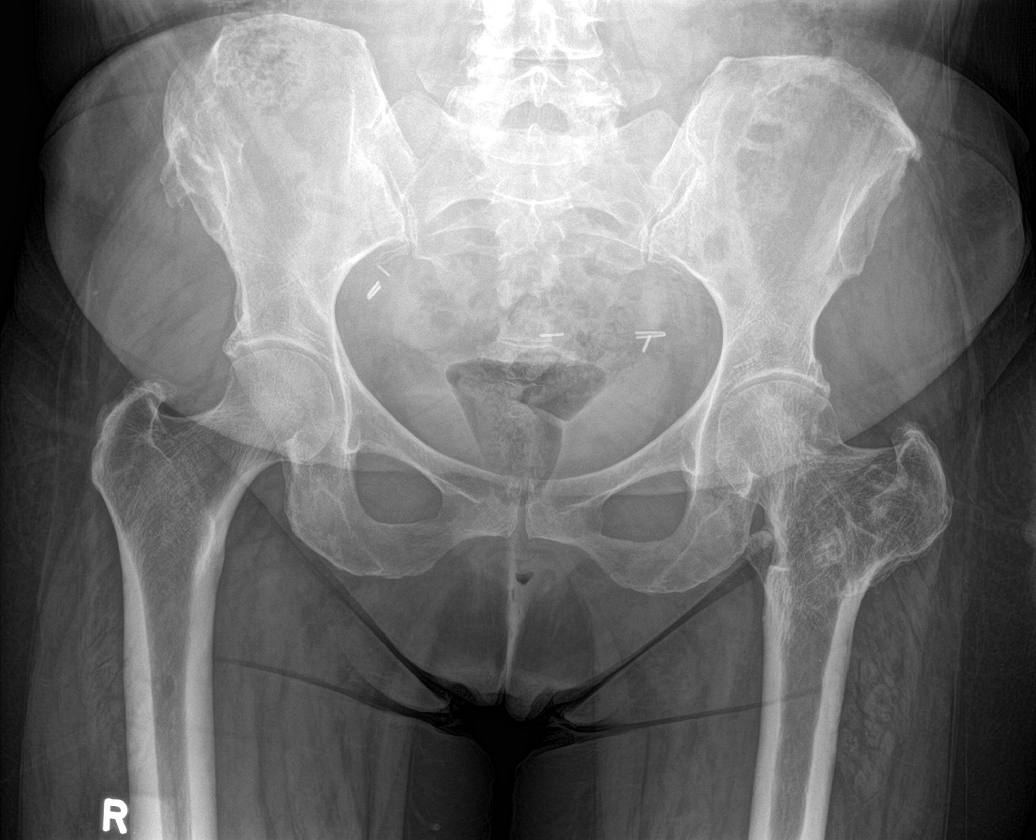

[hip ap (1 of 2)]
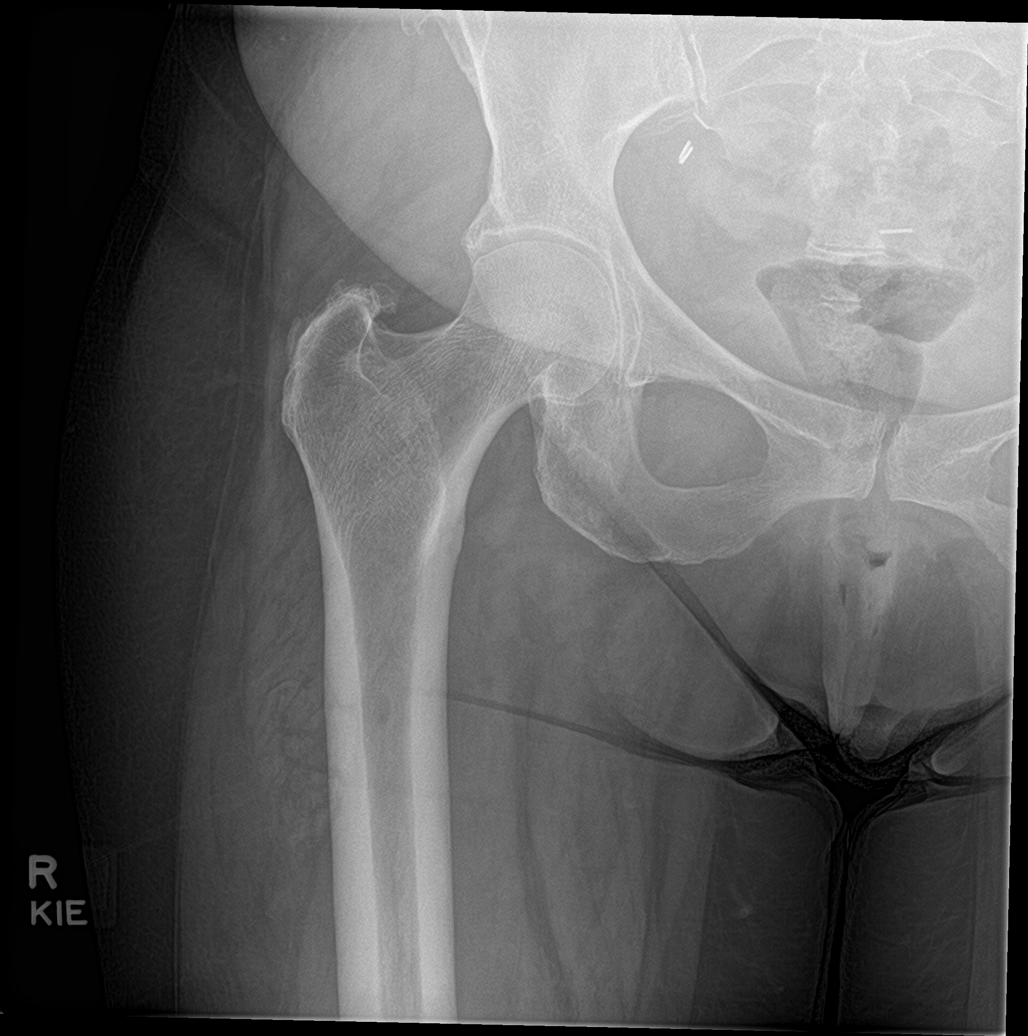

[hip lat (1 of 2)]
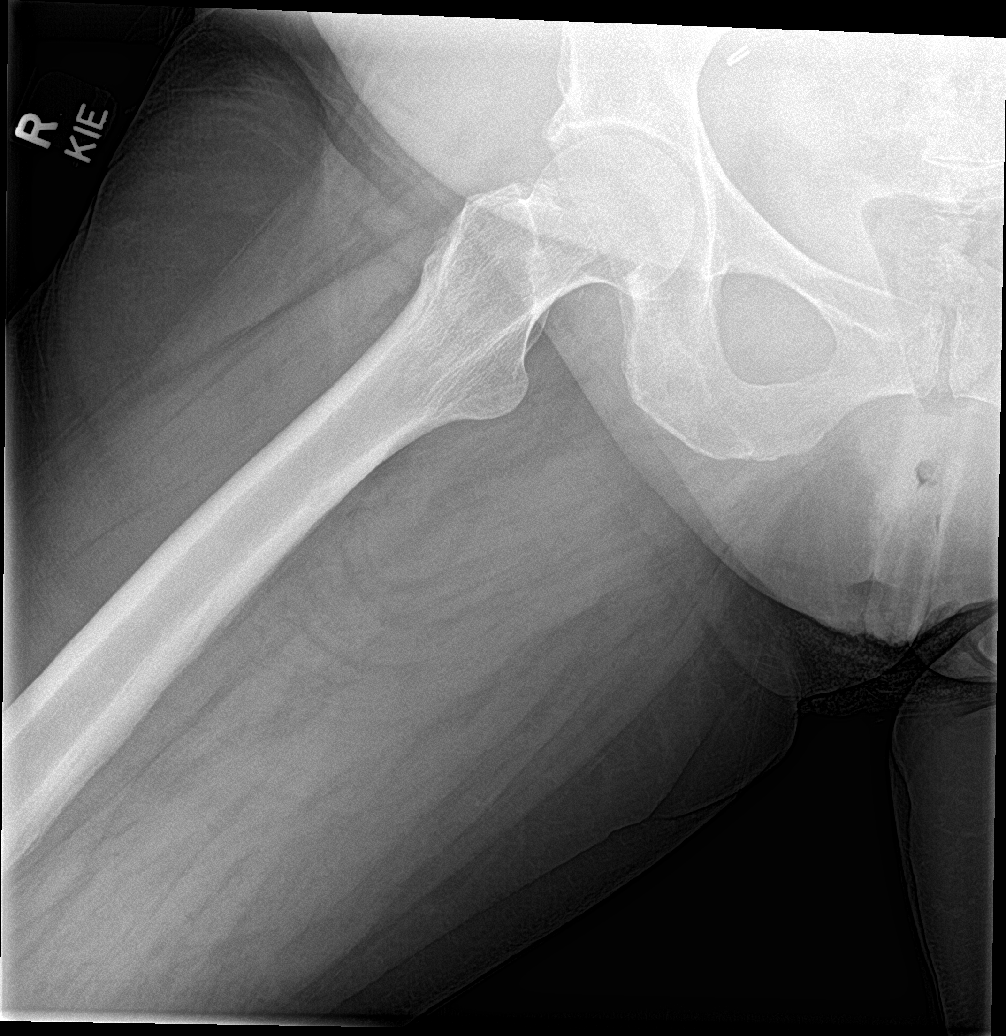

[hip ap (2 of 2)]
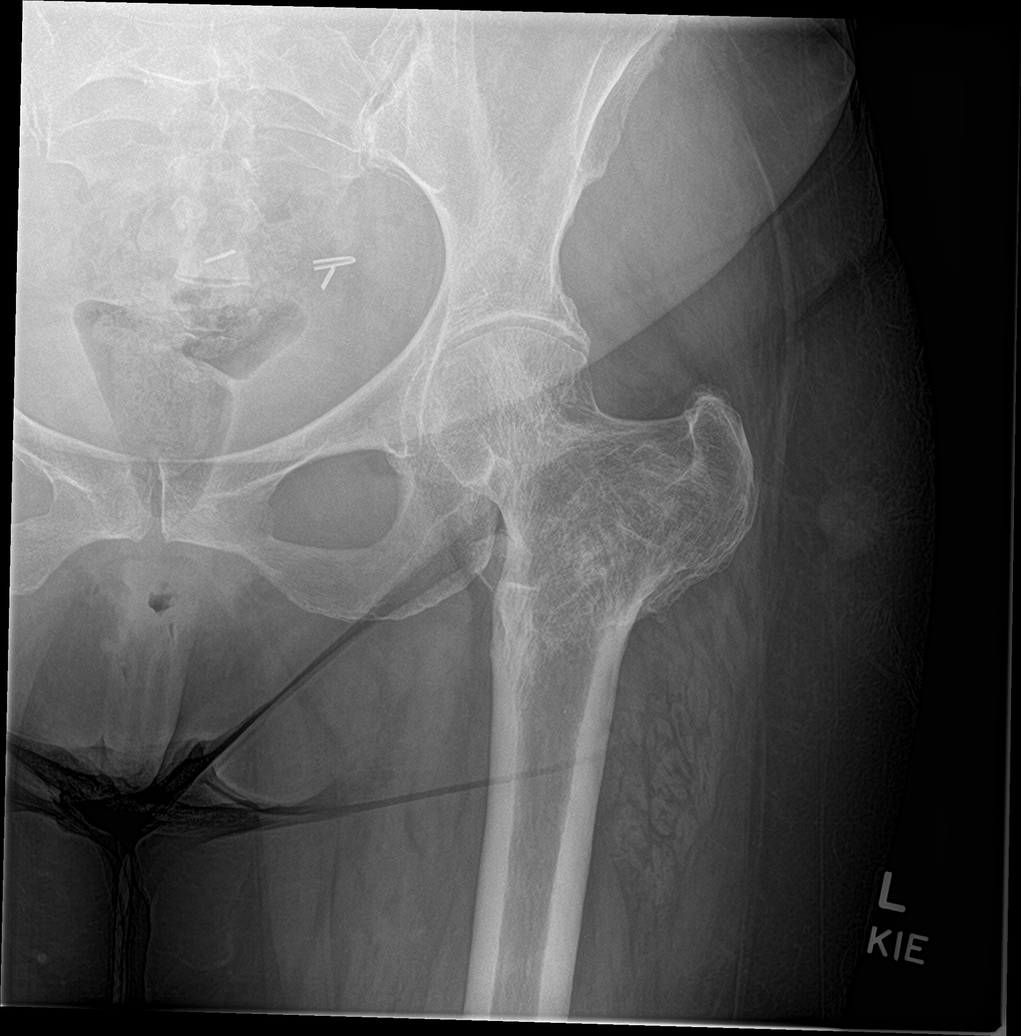

[hip lat (2 of 2)]
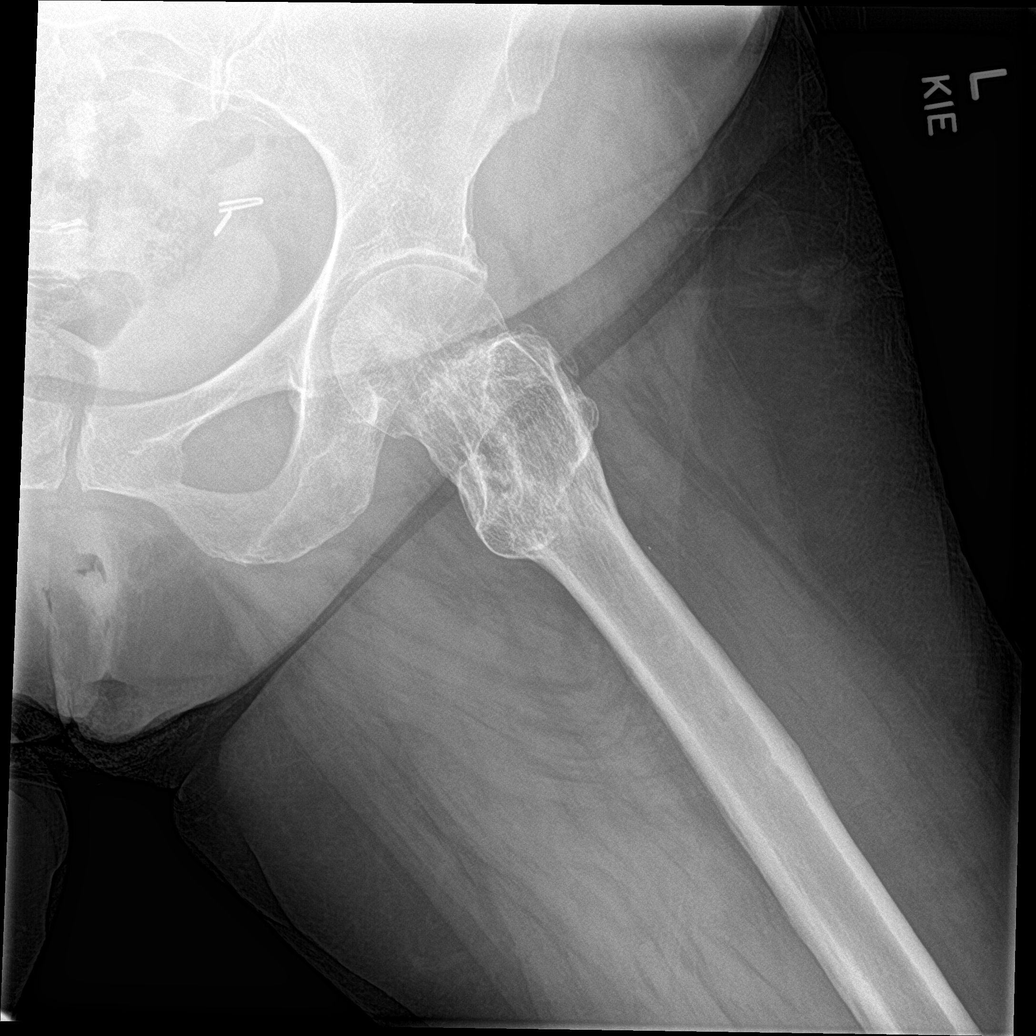

[5 of 5 positions shown; findings below may reference images not displayed]

FINDINGS: Frontal view of the pelvis as well as frontal and frogleg lateral
views of the bilateral hips are obtained. Prior healed trauma
intertrochanteric region left femur again noted, stable. No acute
fracture, subluxation, or dislocation within either hip. There is
bilateral hip osteoarthritis, left greater than right, moderate in
severity. Sacroiliac joints are normal.
IMPRESSION: 1. Bilateral hip osteoarthritis, left greater than right.
2. Prior healed fracture intertrochanteric region left femur.

## 2020-01-07 IMAGING — MR MR LUMBAR SPINE W/O CM
6 series · 48 of 48 positions shown · non-contrast
Comparison: Radiography [DATE].

CLINICAL DATA: Greater than 6 week history of lumbar radiculopathy
and urinary incontinence. Low back and bilateral hip and leg pain.
Fell in [REDACTED].

EXAM:
MRI LUMBAR SPINE WITHOUT CONTRAST
TECHNIQUE: Multiplanar, multisequence MR imaging of the lumbar spine was
performed. No intravenous contrast was administered.

[Series 1: T2 · axial · 4.0mm · 0.78mm/px · z∈[-103,+122]mm · 13 of 38 slices shown (1 of 2)]
[im 1/38]
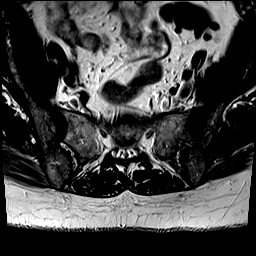
[im 4/38]
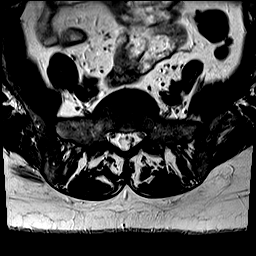
[im 7/38]
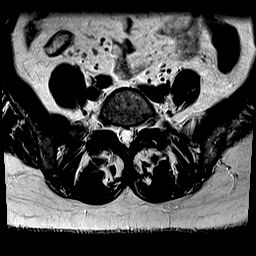
[im 10/38]
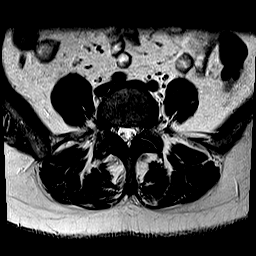
[im 13/38]
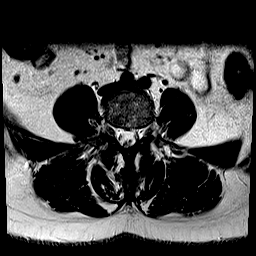
[im 16/38]
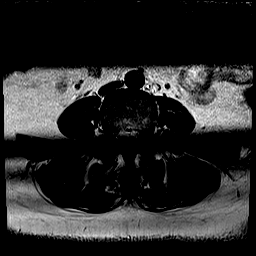
[im 19/38]
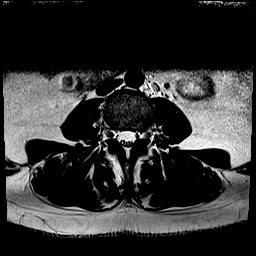
[im 22/38]
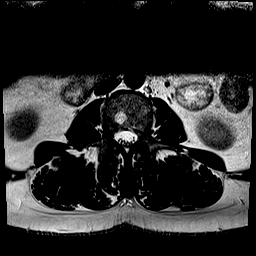
[im 25/38]
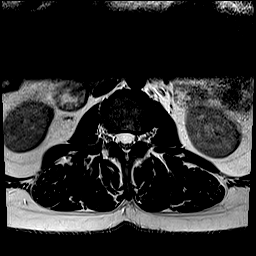
[im 28/38]
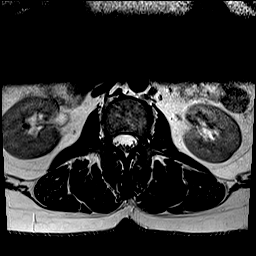
[im 31/38]
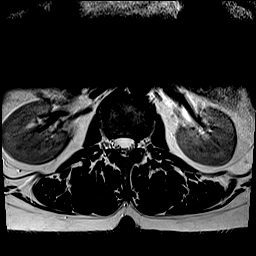
[im 34/38]
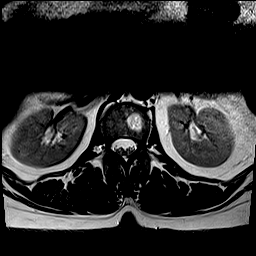
[im 38/38]
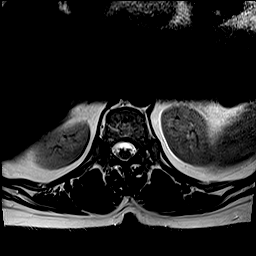

[Series 2: T1 · axial · 4.0mm · 0.78mm/px · z∈[-103,+122]mm · 14 of 38 slices shown (1 of 2)]
[im 1/38]
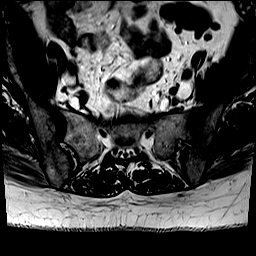
[im 3/38]
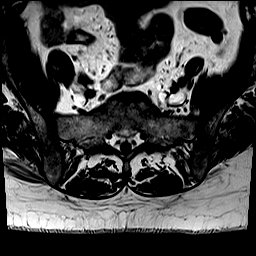
[im 6/38]
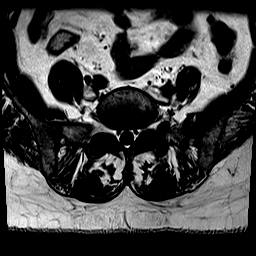
[im 9/38]
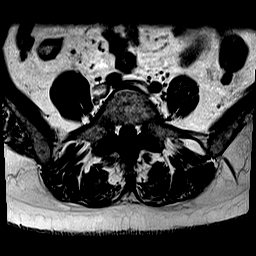
[im 12/38]
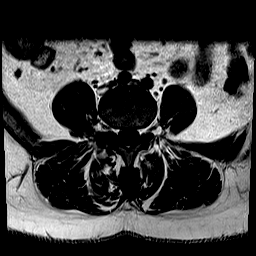
[im 15/38]
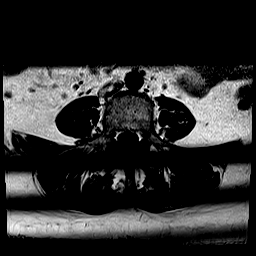
[im 18/38]
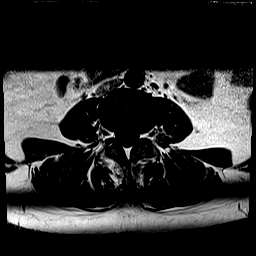
[im 20/38]
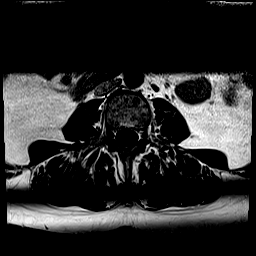
[im 23/38]
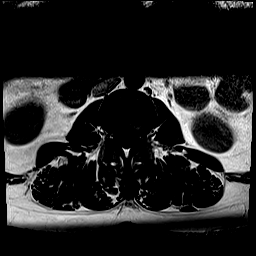
[im 26/38]
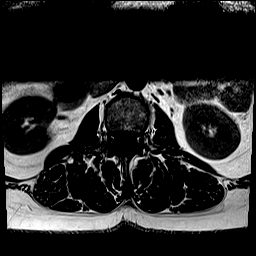
[im 29/38]
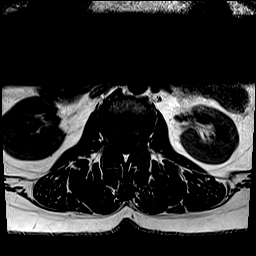
[im 32/38]
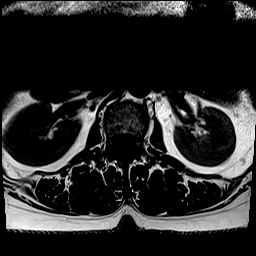
[im 35/38]
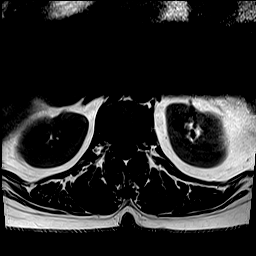
[im 38/38]
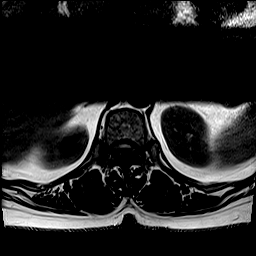

[Series 100: T2 · sagittal · 4.0mm · 0.81mm/px · 5 of 13 slices shown (2 of 2)]
[im 1/13]
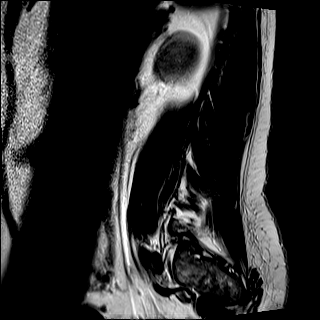
[im 4/13]
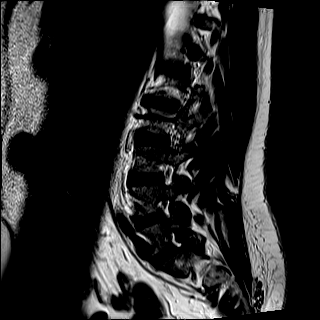
[im 7/13]
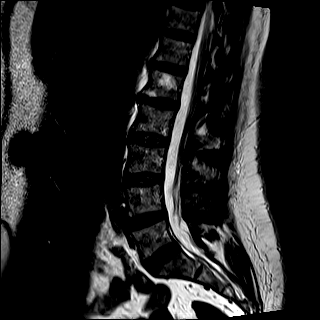
[im 10/13]
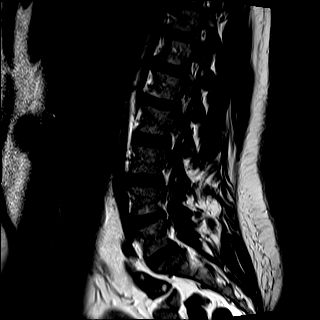
[im 13/13]
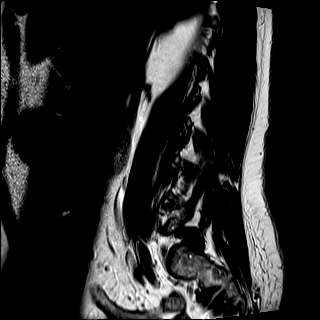

[Series 101: STIR · sagittal · 4.0mm · 1.02mm/px · 5 of 13 slices shown]
[im 1/13]
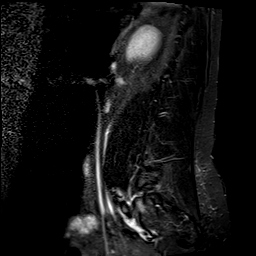
[im 4/13]
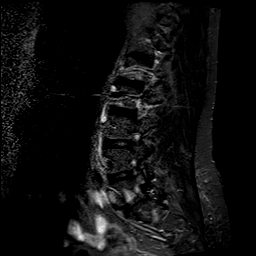
[im 7/13]
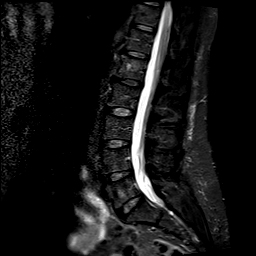
[im 10/13]
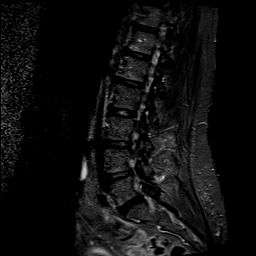
[im 13/13]
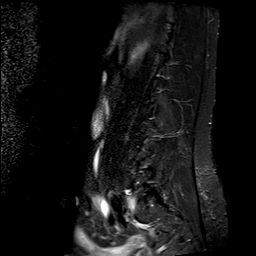

[Series 102: T1 · sagittal · 4.0mm · 1.02mm/px · 5 of 13 slices shown (2 of 2)]
[im 1/13]
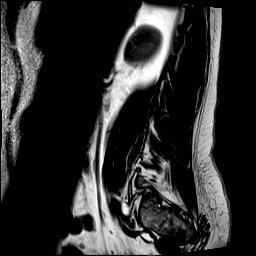
[im 4/13]
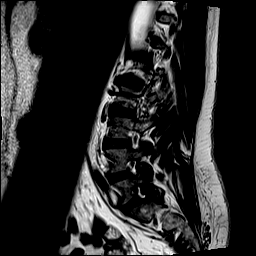
[im 7/13]
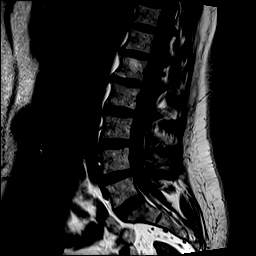
[im 10/13]
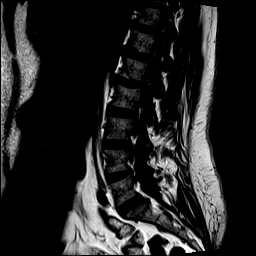
[im 13/13]
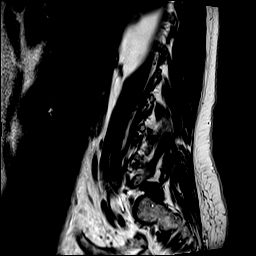

[Series 103: hx · axial · 10.0mm · 0.66mm/px · z∈[-24,+170]mm · 6 of 18 slices shown]
[im 1/18]
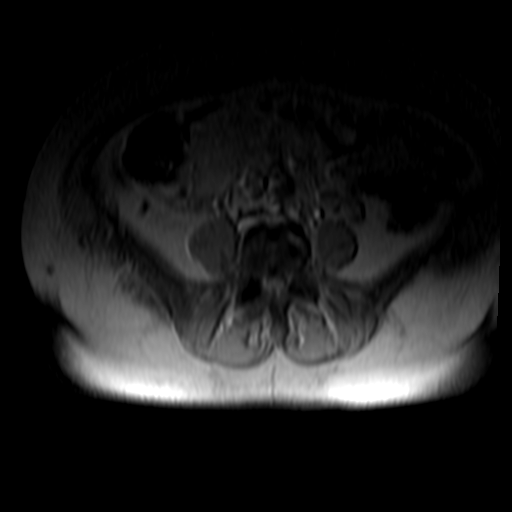
[im 4/18]
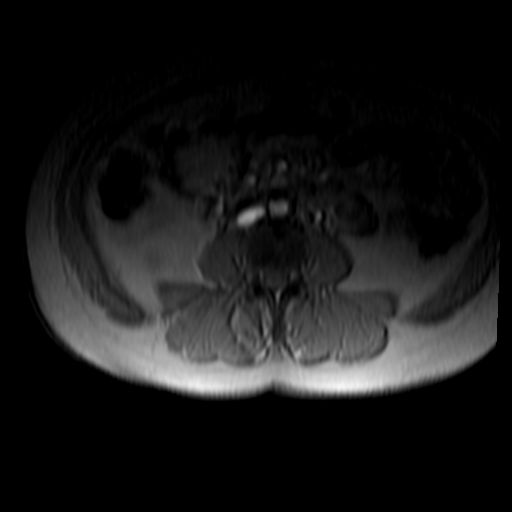
[im 7/18]
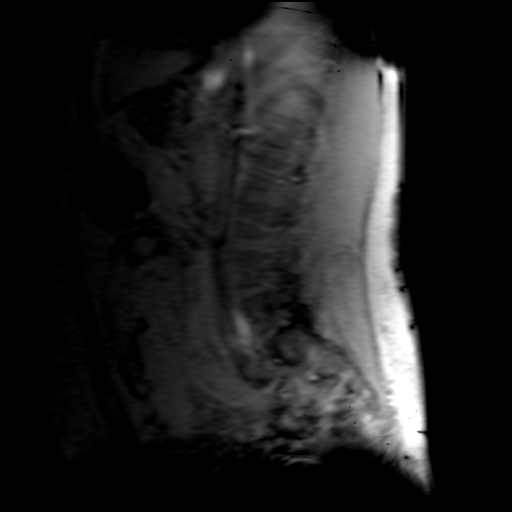
[im 11/18]
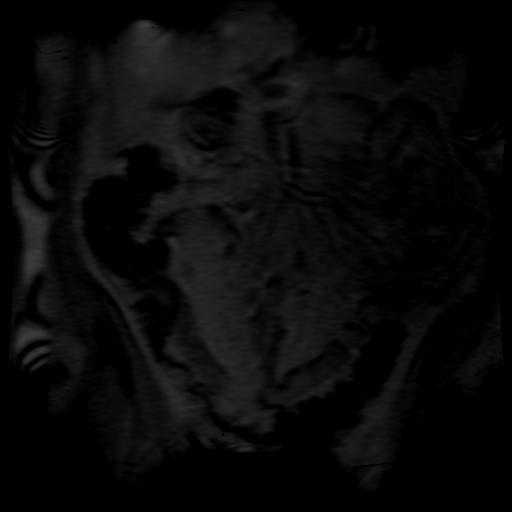
[im 14/18]
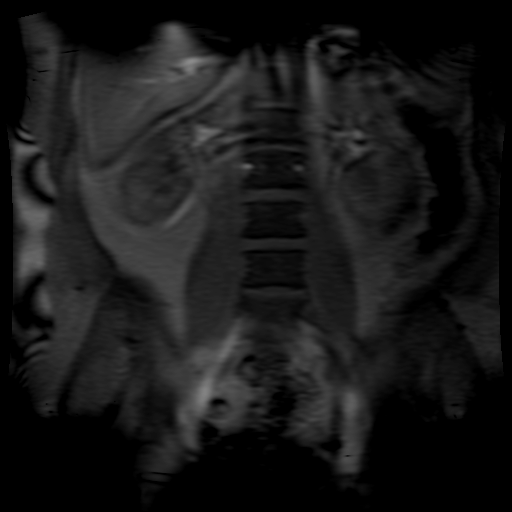
[im 18/18]
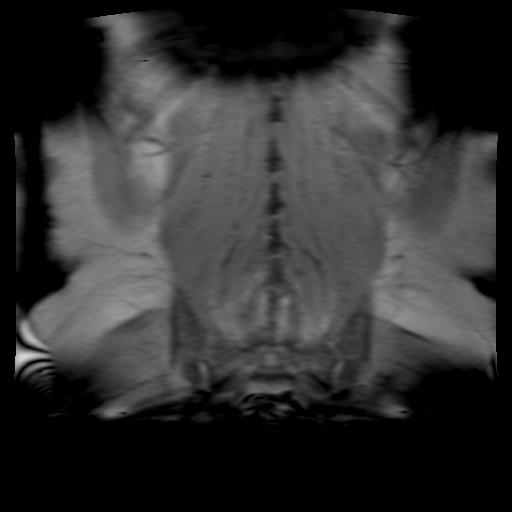

[48 of 48 positions shown; findings below may reference images not displayed]

FINDINGS: Segmentation:  5 lumbar type vertebral bodies.

Alignment:  Normal

Vertebrae: No fracture or significant focal lesion. Benign
hemangiomas within L1 and L3.

Conus medullaris and cauda equina: Conus extends to the L1 level.
Conus and cauda equina appear normal.

Paraspinal and other soft tissues: Negative

Disc levels:

No significant disc pathology. No disc herniation. Mild lower lumbar
facet degeneration at L4-5 and L5-S1 but no slippage or encroachment
upon the neural structures. Mild edematous change of the joints
could contribute to low back pain or referred facet syndrome pain.
IMPRESSION: No traumatic finding.

No disc pathology.  No stenosis or neural compression.

Mild facet osteoarthritis at L4-5 and L5-S1. Mild edema but no
advanced finding. These joints could contribute to low back pain or
referred facet syndrome pain.

## 2020-01-17 ENCOUNTER — Telehealth: Payer: Self-pay | Admitting: *Deleted

## 2020-01-17 DIAGNOSIS — M1652 Unilateral post-traumatic osteoarthritis, left hip: Secondary | ICD-10-CM

## 2020-01-17 NOTE — Telephone Encounter (Signed)
Patient left a message requesting MRI results

## 2020-01-18 NOTE — Telephone Encounter (Signed)
Have called and attempted to reach pt- left message today at 11:15 am.  Will try again later.

## 2020-01-19 NOTE — Telephone Encounter (Signed)
Donna [name] called back on 01/18/20. I spoke with her this morning, and she said to tell Dr. Berline Chough that if she calls, it needs to be after 3:00 when she gets off work.  She cannot talk on phone while working.

## 2020-01-23 NOTE — Telephone Encounter (Signed)
Patient called again today asking for her MRI results

## 2020-01-24 NOTE — Telephone Encounter (Signed)
Will place referral for Ortho to assess pt for L THR- she's "not too young" anymore-  Also asked for Dr Wynn Banker to see pt for epidural injections for back pain.   Please let pt know the plan- I told her when I called to go over imaging,  I'd let her know plan in next 24 hours- I attempted to call Ortho, but they insisted they needed to see her before they made a decision.

## 2020-01-25 ENCOUNTER — Telehealth: Payer: Self-pay | Admitting: *Deleted

## 2020-01-25 MED ORDER — ONDANSETRON HCL 4 MG PO TABS: 4.0000 mg | ORAL_TABLET | Freq: Three times a day (TID) | ORAL | 1 refills | Status: DC | PRN

## 2020-01-25 NOTE — Telephone Encounter (Signed)
Contacted patient and informed patient of Dr. Dahlia Client plan.  Patient verbalized understanding.

## 2020-01-25 NOTE — Telephone Encounter (Signed)
The tear in her hip might heal- but that's another reason I've referred her to Ortho.   Of note, I sent in the Zofran 4 mg 3x/day as needed for nausea ML

## 2020-01-25 NOTE — Telephone Encounter (Signed)
I contacted patient to let patient know Dr. Rodell Perna plan for her left hip.  Patient asks if the tear in her right hip will heal on its own.   Patient states Cymbalta is making her nauseous and is asking if Dr. Berline Chough would please send in something for the nausea.

## 2020-01-26 ENCOUNTER — Other Ambulatory Visit: Payer: Self-pay | Admitting: Podiatry

## 2020-01-26 ENCOUNTER — Encounter: Payer: Self-pay | Admitting: Podiatry

## 2020-01-26 ENCOUNTER — Ambulatory Visit (INDEPENDENT_AMBULATORY_CARE_PROVIDER_SITE_OTHER): Payer: 59

## 2020-01-26 ENCOUNTER — Ambulatory Visit (INDEPENDENT_AMBULATORY_CARE_PROVIDER_SITE_OTHER): Payer: 59 | Admitting: Podiatry

## 2020-01-26 ENCOUNTER — Other Ambulatory Visit: Payer: Self-pay

## 2020-01-26 DIAGNOSIS — M722 Plantar fascial fibromatosis: Secondary | ICD-10-CM

## 2020-01-26 DIAGNOSIS — M778 Other enthesopathies, not elsewhere classified: Secondary | ICD-10-CM

## 2020-01-26 MED ORDER — MELOXICAM 15 MG PO TABS: 15.0000 mg | ORAL_TABLET | Freq: Every day | ORAL | 3 refills | Status: AC

## 2020-01-26 MED ORDER — METHYLPREDNISOLONE 4 MG PO TBPK
ORAL_TABLET | ORAL | 0 refills | Status: DC
Start: 2020-01-26 — End: 2020-03-01

## 2020-01-26 NOTE — Progress Notes (Signed)
  Subjective:  Patient ID: Donna [name], female    DOB: [birth year],  MRN: [id number] HPI Chief Complaint  Patient presents with  . Foot Pain    Plantar "whole bottom" bilateral (L>R) - burning sensations x 2 years, worsened recently, more constant now, notices medial foot cramps and tightens up intermittent causing severe pain, tried stretching - no help  *Does have leg spasms and back problems*  . New Patient (Initial Visit)    54 y.o. female presents with the above complaint.   ROS: Denies fever chills nausea vomiting muscle aches pains calf pain back pain chest pain shortness of breath.  Past Medical History:  Diagnosis Date  . GERD (gastroesophageal reflux disease)    Past Surgical History:  Procedure Laterality Date  . ABDOMINAL HYSTERECTOMY    . CHOLECYSTECTOMY    . SPINE SURGERY      Current Outpatient Medications:  .  meloxicam (MOBIC) 15 MG tablet, Take 1 tablet (15 mg total) by mouth daily., Disp: 30 tablet, Rfl: 3 .  methylPREDNISolone (MEDROL DOSEPAK) 4 MG TBPK tablet, 6 day dose pack - take as directed, Disp: 21 tablet, Rfl: 0  No Known Allergies Review of Systems Objective:  There were no vitals filed for this visit.  General: Well developed, nourished, in no acute distress, alert and oriented x3   Dermatological: Skin is warm, dry and supple bilateral. Nails x 10 are well maintained; remaining integument appears unremarkable at this time. There are no open sores, no preulcerative lesions, no rash or signs of infection present.  Vascular: Dorsalis Pedis artery and Posterior Tibial artery pedal pulses are 2/4 bilateral with immedate capillary fill time. Pedal hair growth present. No varicosities and no lower extremity edema present bilateral.   Neruologic: Grossly intact via light touch bilateral. Vibratory intact via tuning fork bilateral. Protective threshold with Semmes Wienstein monofilament intact to all pedal sites bilateral. Patellar and Achilles deep  tendon reflexes 2+ bilateral. No Babinski or clonus noted bilateral.   Musculoskeletal: No gross boney pedal deformities bilateral. No pain, crepitus, or limitation noted with foot and ankle range of motion bilateral. Muscular strength 5/5 in all groups tested bilateral.  Pain on palpation medial calcaneal tubercles bilateral.  Pain on palpation to the fourth fifth tarsometatarsal joint bilaterally and on palpation of the posterior tibial tendon.  Gait: Unassisted, Nonantalgic.    Radiographs:  Radiographs demonstrate osseously mature individual with pes planus of the left foot not as much on the right foot.  Plantar distally oriented calcaneal heel spurs bilaterally soft tissue increase in density at the plantar fascial calcaneal insertion site bilateral.  Assessment & Plan:   Assessment: Plantar fasciitis with pes planus.  Plan: We discussed etiology pathology conservative surgical therapies at this point I injected bilateral heels today 20 mg Kenalog 5 mg of Marcaine placed her in a plantar fascial brace bilaterally and single night splint.  Discussed appropriate shoe gear stretching exercises ice therapy and shoe gear modifications.  We also discussed oral anti-inflammatories consisting of a Medrol Dosepak to be followed by meloxicam.     Iley Breeden T. McDonald, [place]

## 2020-01-26 NOTE — Patient Instructions (Signed)

## 2020-02-01 ENCOUNTER — Ambulatory Visit: Payer: 59 | Admitting: Orthopaedic Surgery

## 2020-02-10 ENCOUNTER — Encounter: Payer: 59 | Admitting: Physical Medicine and Rehabilitation

## 2020-02-15 ENCOUNTER — Encounter: Payer: Self-pay | Admitting: Orthopaedic Surgery

## 2020-02-15 ENCOUNTER — Ambulatory Visit (INDEPENDENT_AMBULATORY_CARE_PROVIDER_SITE_OTHER): Payer: 59 | Admitting: Orthopaedic Surgery

## 2020-02-15 ENCOUNTER — Other Ambulatory Visit: Payer: Self-pay

## 2020-02-15 ENCOUNTER — Ambulatory Visit: Payer: Self-pay

## 2020-02-15 VITALS — Ht 60.0 in | Wt 161.0 lb

## 2020-02-15 DIAGNOSIS — M1612 Unilateral primary osteoarthritis, left hip: Secondary | ICD-10-CM

## 2020-02-15 DIAGNOSIS — M4726 Other spondylosis with radiculopathy, lumbar region: Secondary | ICD-10-CM

## 2020-02-15 NOTE — Progress Notes (Signed)
Office Visit Note   Patient: Donna [name]           Date of Birth: 1966/08/20           MRN: [id number] Visit Date: 02/15/2020              Requested by: Donna Rouge, MD [address] [place],  [place] [address] PCP: Donna Rakes, MD   Assessment & Plan: Visit Diagnoses:  1. Osteoarthritis of spine with radiculopathy, lumbar region   2. Primary osteoarthritis of left hip     Plan: Impression is chronic low back pain and left hip pain.  I feel that the majority of her pain is coming from her back with some referred pain to her left hip.  She does have a chronic deformity of the proximal femur but I discussed with her that hip replacement is not going to resolve this.  Based on MRI findings so which show that there is mild OA I have recommended a cortisone injection today which she tolerated well with Dr. Prince [name].  Overall I think her pain is going to be difficult to treat and she may benefit from referral to a chronic pain management clinic  I also briefly discussed the importance of weight loss and how this affects back and hip pain.  Follow-Up Instructions: Return if symptoms worsen or fail to improve.   Orders:  Orders Placed This Encounter  Procedures  . US Guided Needle Placement - No Linked Charges   No orders of the defined types were placed in this encounter.     Procedures: No procedures performed   Clinical Data: No additional findings.   Subjective: Chief Complaint  Patient presents with  . Left Hip - New Patient (Initial Visit), Pain    Donna [name] is a 54 year old female new patient here for evaluation of chronic low back pain mainly and left hip pain.  She has had this for many years.  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  She has had an MRI of the left hip in April which showed mild OA with severe atrophy of the vastus  lateralis.  The right hip showed that she had tendinosis with a partial tear of the abductor tendon insertion.  She mainly points to her low back as a source of pain.  She has also had a lumbar spine MRI which shows facet disease at L4-5 and L5-S1 with mild edema.  There is no stenosis.  She states that she has a lot of pain when she has to stand and walk all day as a Child psychotherapist.  Her pain is mainly lower back which wraps around to the left hip and sometimes into the groin.  She has done physical therapy in [place] and also underwent cortisone injection which she said only gave her very temporary relief.  She has minimal groin pain.   Review of Systems  Constitutional: Negative.   HENT: Negative.   Eyes: Negative.   Respiratory: Negative.   Cardiovascular: Negative.   Endocrine: Negative.   Musculoskeletal: Negative.   Neurological: Negative.   Hematological: Negative.   Psychiatric/Behavioral: Negative.   All other systems reviewed and are negative.    Objective: Vital Signs: Ht 5' (1.524 m)   Wt 161 lb (73 kg)   BMI 31.44 kg/m   Physical Exam Vitals and nursing note  reviewed.  Constitutional:      Appearance: She is well-developed.  HENT:     Head: Normocephalic and atraumatic.  Pulmonary:     Effort: Pulmonary effort is normal.  Abdominal:     Palpations: Abdomen is soft.  Musculoskeletal:     Cervical back: Neck supple.  Skin:    General: Skin is warm.     Capillary Refill: Capillary refill takes less than 2 seconds.  Neurological:     Mental Status: She is alert and oriented to person, place, and time.  Psychiatric:        Behavior: Behavior normal.        Thought Content: Thought content normal.        Judgment: Judgment normal.     Ortho Exam Left hip shows no significant tenderness on the lateral side.  She has moderate catching pain with internal and external rotation of the hip.  No sciatic tension signs.  Lumbar spine and SI joints are all diffusely tender.   No focal motor or sensory deficits distally. Specialty Comments:  No specialty comments available.  Imaging: US Guided Needle Placement - No Linked Charges  Result Date: 02/15/2020 Ultrasound-guided left hip injection: After sterile prep with Betadine, injected 8 cc 1% lidocaine without epinephrine and 40 mg methylprednisolone using a 22-gauge spinal needle, passing the needle through the iliofemoral ligament into the femoral head/neck junction.  Injectate was seen filling the joint capsule.  She did not have a lot of immediate relief during the anesthetic phase.  She will follow up as scheduled.    PMFS History: Patient Active Problem List   Diagnosis Date Noted  . Night muscle spasms 05/12/2016  . Lumbar back pain with radiculopathy affecting left lower extremity 02/26/2016  . Osteoarthritis of spine with radiculopathy, lumbar region 02/26/2016  . Primary osteoarthritis of left hip 02/26/2016  . Cervical radiculopathy 08/02/2015   Past Medical History:  Diagnosis Date  . GERD (gastroesophageal reflux disease)     Family History  Problem Relation Age of Onset  . Heart disease Mother   . Cancer Father   . Heart disease Father     Past Surgical History:  Procedure Laterality Date  . ABDOMINAL HYSTERECTOMY    . CHOLECYSTECTOMY    . SPINE SURGERY     Social History   Occupational History  . Not on file  Tobacco Use  . Smoking status: Former Research scientist (life sciences)  . Smokeless tobacco: Never Used  Substance and Sexual Activity  . Alcohol use: Not on file  . Drug use: Not on file  . Sexual activity: Not on file

## 2020-02-15 NOTE — Progress Notes (Signed)
Subjective: Patient is here for ultrasound-guided intra-articular left hip injection.   Chronic pain since having a tumor removed as a child.  Objective: She has pain and slightly decreased range of motion with passive flexion and internal rotation.  Procedure: Ultrasound-guided left hip injection: After sterile prep with Betadine, injected 8 cc 1% lidocaine without epinephrine and 40 mg methylprednisolone using a 22-gauge spinal needle, passing the needle through the iliofemoral ligament into the femoral head/neck junction.  Injectate was seen filling the joint capsule.  She did not have a lot of immediate relief during the anesthetic phase.  She will follow up as scheduled.

## 2020-03-01 ENCOUNTER — Other Ambulatory Visit: Payer: Self-pay

## 2020-03-01 ENCOUNTER — Ambulatory Visit (INDEPENDENT_AMBULATORY_CARE_PROVIDER_SITE_OTHER): Payer: 59 | Admitting: Podiatry

## 2020-03-01 ENCOUNTER — Encounter: Payer: Self-pay | Admitting: Podiatry

## 2020-03-01 DIAGNOSIS — M722 Plantar fascial fibromatosis: Secondary | ICD-10-CM | POA: Diagnosis not present

## 2020-03-03 NOTE — Progress Notes (Signed)
She presents today states that she is about 40% improved as she refers to plantar fasciitis.  States that the plantar fascial brace causes her to swell.  So she has not been wearing them.  Objective: Vital signs are stable she is alert and oriented x3.  Pulses are palpable.  She has pain to palpation medial calcaneal tubercles bilaterally.  It appears that she also has some new shoes.  No swelling at this point.  Assessment: Plantar fasciitis 40% improved bilaterally.  Plan: Continue all conservative therapies I will go ahead and reinjected bilateral heels today 20 mg Kenalog 5 mg Marcaine we will follow-up with her in 1 month at which time may need to discuss orthotics once again.  Follow-up with me at that time

## 2020-04-02 ENCOUNTER — Encounter: Payer: 59 | Attending: Physical Medicine and Rehabilitation | Admitting: Physical Medicine and Rehabilitation

## 2020-04-02 DIAGNOSIS — M5416 Radiculopathy, lumbar region: Secondary | ICD-10-CM | POA: Insufficient documentation

## 2020-04-02 DIAGNOSIS — M1652 Unilateral post-traumatic osteoarthritis, left hip: Secondary | ICD-10-CM | POA: Insufficient documentation

## 2020-04-02 DIAGNOSIS — M25551 Pain in right hip: Secondary | ICD-10-CM | POA: Insufficient documentation

## 2020-04-02 DIAGNOSIS — M79672 Pain in left foot: Secondary | ICD-10-CM | POA: Insufficient documentation

## 2020-04-10 ENCOUNTER — Other Ambulatory Visit: Payer: Self-pay

## 2020-04-10 ENCOUNTER — Ambulatory Visit (INDEPENDENT_AMBULATORY_CARE_PROVIDER_SITE_OTHER): Payer: 59 | Admitting: Podiatry

## 2020-04-10 DIAGNOSIS — M722 Plantar fascial fibromatosis: Secondary | ICD-10-CM

## 2020-04-11 NOTE — Progress Notes (Signed)
She presents today for follow-up of her plantar fasciitis bilaterally.  States that she is about 60% improved she has some pain to some degree every day and is starting to experience more cramping.  Objective: Vital signs are stable alert and oriented x3.  Pulses are palpable.  She has pain on palpation medial calcaneal tubercle of the left heel.  No open lesions or wounds.  Assessment: Plantar fasciitis.  Resolving at 60%.  Plan: At this point I would like to get her into a set of orthotics I reinjected her heel left.  And she will continue all other conservative therapies.

## 2020-04-27 ENCOUNTER — Other Ambulatory Visit: Payer: Self-pay | Admitting: Urology

## 2020-04-30 ENCOUNTER — Other Ambulatory Visit: Payer: Self-pay

## 2020-04-30 ENCOUNTER — Encounter (HOSPITAL_BASED_OUTPATIENT_CLINIC_OR_DEPARTMENT_OTHER): Payer: Self-pay | Admitting: Urology

## 2020-04-30 NOTE — Progress Notes (Signed)
Spoke w/ via phone for pre-op interview---PT Lab needs dos----    none           Lab results------none COVID test ------05-05-2020 Arrive at -------900 am 05-08-2020 NPO after MN NO Solid Food.  Clear liquids from MN until---800 am then npo Medications to take morning of surgery -----none Diabetic medication -----n/a Patient Special Instructions -----none Pre-Op special Istructions -----none Patient verbalized understanding of instructions that were given at this phone interview. Patient denies shortness of breath, chest pain, fever, cough at this phone interview.

## 2020-05-01 ENCOUNTER — Other Ambulatory Visit: Payer: 59 | Admitting: Orthotics

## 2020-05-04 ENCOUNTER — Other Ambulatory Visit: Payer: Self-pay

## 2020-05-04 ENCOUNTER — Encounter: Payer: 59 | Attending: Physical Medicine and Rehabilitation | Admitting: Physical Medicine and Rehabilitation

## 2020-05-04 ENCOUNTER — Encounter: Payer: Self-pay | Admitting: Physical Medicine and Rehabilitation

## 2020-05-04 VITALS — BP 145/83 | HR 72 | Temp 98.3°F | Ht 60.0 in | Wt 161.4 lb

## 2020-05-04 DIAGNOSIS — M25551 Pain in right hip: Secondary | ICD-10-CM | POA: Diagnosis present

## 2020-05-04 DIAGNOSIS — M4726 Other spondylosis with radiculopathy, lumbar region: Secondary | ICD-10-CM

## 2020-05-04 DIAGNOSIS — M1652 Unilateral post-traumatic osteoarthritis, left hip: Secondary | ICD-10-CM | POA: Diagnosis not present

## 2020-05-04 DIAGNOSIS — M1612 Unilateral primary osteoarthritis, left hip: Secondary | ICD-10-CM

## 2020-05-04 DIAGNOSIS — M62838 Other muscle spasm: Secondary | ICD-10-CM

## 2020-05-04 DIAGNOSIS — M5416 Radiculopathy, lumbar region: Secondary | ICD-10-CM

## 2020-05-04 DIAGNOSIS — M79672 Pain in left foot: Secondary | ICD-10-CM | POA: Insufficient documentation

## 2020-05-04 DIAGNOSIS — G5703 Lesion of sciatic nerve, bilateral lower limbs: Secondary | ICD-10-CM

## 2020-05-04 MED ORDER — DIAZEPAM 5 MG PO TABS
5.0000 mg | ORAL_TABLET | Freq: Two times a day (BID) | ORAL | 1 refills | Status: AC
Start: 2020-05-04 — End: 2020-05-09

## 2020-05-04 MED ORDER — TRAMADOL HCL 50 MG PO TABS: 50.0000 mg | ORAL_TABLET | Freq: Four times a day (QID) | ORAL | 0 refills | Status: AC | PRN

## 2020-05-04 MED ORDER — DULOXETINE HCL 60 MG PO CPEP: 60.0000 mg | ORAL_CAPSULE | Freq: Every day | ORAL | 3 refills | Status: AC

## 2020-05-04 NOTE — Patient Instructions (Signed)
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   1. Magnesium over the counter- 400 mg 1-2 pills/day- only side effect is looser stools.   2. Having surgery for bladder- is doing cystoscopy. Already hx of bladder tacking.   3. Going to [place]- til February.   4.  Valium 5 mg BID- gold standard muscle relaxant- can refill x1- after 5 days-   5. Tennis balls- sit on tennis ball for 15 minutes- no more than 1 hour/day-   x 30 days- then do it as needed.  Then do stretch where you push against them for 5 seconds, 3-5x and stretch buttock more each time.   6.  Tramadol 50-100 mg 2x/day as needed- can take 1 tabs every 6 hours as needed- but I think 2 tabs 2x/day works better.    7. Don't take valium and tramadol at same time at high doses- can take 1 tab tramadol and 1 tab valium max; or with alcohol-   8. F/U call in 1 week - to get a refill of tramadol- we don't need to  do Valium  Again- is short term  9. Refilled Duloxetine 60 mg capsules for 90 days.
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
## 2020-05-05 ENCOUNTER — Other Ambulatory Visit (HOSPITAL_COMMUNITY)
Admission: RE | Admit: 2020-05-05 | Discharge: 2020-05-05 | Disposition: A | Discharge status: Home / Self Care | Payer: 59 | Source: Ambulatory Visit | Attending: Urology | Admitting: Urology

## 2020-05-05 DIAGNOSIS — Z20822 Contact with and (suspected) exposure to covid-19: Secondary | ICD-10-CM | POA: Diagnosis not present

## 2020-05-05 DIAGNOSIS — Z01812 Encounter for preprocedural laboratory examination: Secondary | ICD-10-CM | POA: Insufficient documentation

## 2020-05-05 LAB — SARS CORONAVIRUS 2 (TAT 6-24 HRS): SARS Coronavirus 2: NEGATIVE

## 2020-05-07 NOTE — H&P (Signed)
/HPI: cc: UDS follow up   04/26/20: 54 year old woman with history of mixed urinary incontinence returns for follow-up after urodynamic study. She has not responded to Myrbetriq and had no change in symptoms after a mid urethral sling was placed. Urodynamic shows small bladder capacity with unstable contractions as well as stress urinary incontinence.   UDS SUMMARY  Donna [name] held a max capacity of approx. 240 mls. Her 1st sensation was felt at 13 mls and was catheter induced. It triggered an unstable contraction, and she voided involuntarily. Filling was continued. She had a second low amplitude unstable contraction at approx. 200 mls with another involuntary void. There was positive SUI with low LPPs. She was able to generate a voluntary contraction and void. EMG leads were not tracing well during her void. She left a scant residual. Mild trabeculation was noted. No reflux was seen. She told me her incontinence requires heavy pads and is effecting her quality of life.   04/12/20: 54 year old woman with a history of stress urinary incontinence status post sling in 01/19/2017 by urologist in [place]. Patient was spastic about for follow-up however her husband passed away in 2018-01-19 and she never did. She did not have any improvement after the sling was placed and is using 3-5 pads per day. She is also having nighttime leakage and significant leakage with coughing and sneezing. She denies UTIs, hematuria. She does have sudden urgency as well.   OAB Questionaire: 5, not answered, 5,5,3,3,5,5 = 31/40     ALLERGIES: No Known Drug Allergies    MEDICATIONS: Duloxetine Hcl  Meloxicam 15 mg tablet  Ondansetron Hcl 4 mg tablet     GU PSH: Complex cystometrogram, w/ void pressure and urethral pressure profile studies, any technique - 04/20/2020 Complex Uroflow - 04/20/2020 Emg surf Electrd - 04/20/2020 Inject For cystogram - 04/20/2020 Intrabd voidng Press - 04/20/2020 Ovary Removal Partial or Total, Left Sling      NON-GU PSH: Hip Arthroscopy/surgery, Left Partial Hysterectomy Remove Gallbladder     GU PMH: Urge incontinence - 04/20/2020 Nocturia - 04/12/2020 Stress Incontinence, Would like to obtain urodynamic study as patient is already had a sling and has some mixed urinary symptoms. I discussed with patient based on the urodynamics results she may be a candidate for urethral bulking. She will follow-up after the urinate which has been done. We may also discussed pelvic floor physical therapy to see if this may help her symptoms. - 04/12/2020 Urinary Urgency - 04/12/2020    NON-GU PMH: Arthritis GERD    FAMILY HISTORY: heart failure - Runs in Family Hypertension - Runs in Family Prostate Cancer - Father sickle cell anemia - Runs in Family   SOCIAL HISTORY: Marital Status: Widowed Preferred Language: English; Ethnicity: Not Hispanic Or Latino; Race: White Current Smoking Status: Patient does not smoke anymore. Has not smoked since 03/15/2014.   Tobacco Use Assessment Completed: Used Tobacco in last 30 days? Has never drank.  Drinks 2 caffeinated drinks per day.    REVIEW OF SYSTEMS:    GU Review Female:   Patient denies frequent urination, hard to postpone urination, burning /pain with urination, get up at night to urinate, leakage of urine, stream starts and stops, trouble starting your stream, have to strain to urinate, and being pregnant.  Gastrointestinal (Upper):   Patient denies nausea, vomiting, and indigestion/ heartburn.  Gastrointestinal (Lower):   Patient denies diarrhea and constipation.  Constitutional:   Patient denies fever, night sweats, weight loss, and fatigue.  Skin:   Patient  denies skin rash/ lesion and itching.  Eyes:   Patient denies blurred vision and double vision.  Ears/ Nose/ Throat:   Patient denies sinus problems and sore throat.  Hematologic/Lymphatic:   Patient denies swollen glands and easy bruising.  Cardiovascular:   Patient denies leg swelling and chest  pains.  Respiratory:   Patient denies cough and shortness of breath.  Endocrine:   Patient denies excessive thirst.  Musculoskeletal:   Patient denies back pain and joint pain.  Neurological:   Patient denies headaches and dizziness.  Psychologic:   Patient denies depression and anxiety.   VITAL SIGNS:      04/26/2020 10:51 AM  Weight 155 lb / 70.31 kg  Height 60 in / 152.4 cm  BP 145/81 mmHg  Pulse 61 /min  BMI 30.3 kg/m   GU PHYSICAL EXAMINATION:    Cervix: S/P Hysterectomy  Uterus: S/P Hysterectomy  Adnexa / Parametria: No tenderness. No adnexal mass. Normal left ovary. Normal right ovary.   MULTI-SYSTEM PHYSICAL EXAMINATION:    Constitutional: Well-nourished. No physical deformities. Normally developed. Good grooming.  Neck: Neck symmetrical, not swollen. Normal tracheal position.  Respiratory: No labored breathing, no use of accessory muscles.   Cardiovascular: Normal temperature  Skin: No paleness, no jaundice, no cyanosis. No lesion, no ulcer, no rash.  Neurologic / Psychiatric: Oriented to time, oriented to place, oriented to person. No depression, no anxiety, no agitation.  Gastrointestinal: No mass, no tenderness, no rigidity, non obese abdomen.  Eyes: Normal conjunctivae. Normal eyelids.  Ears, Nose, Mouth, and Throat: Left ear no scars, no lesions, no masses. Right ear no scars, no lesions, no masses. Nose no scars, no lesions, no masses. Normal hearing. Normal lips.  Musculoskeletal: Normal gait and station of head and neck.     Complexity of Data:  Records Review:   POC Tool  Urine Test Review:   Urinalysis  Urodynamics Review:   Review Urodynamics Tests   PROCEDURES:          Urinalysis Dipstick Dipstick Cont'd  Color: Yellow Bilirubin: Neg mg/dL  Appearance: Clear Ketones: Neg mg/dL  Specific Gravity: 1.696 Blood: Neg ery/uL  pH: 6.0 Protein: Neg mg/dL  Glucose: Neg mg/dL Urobilinogen: 0.2 mg/dL    Nitrites: Neg    Leukocyte Esterase: Neg leu/uL     ASSESSMENT:      ICD-10 Details  1 GU:   Urge incontinence - N39.41 Chronic, Stable - Based on patient's urodynamic study which shows a small capacity bladder and both urge and stress urinary incontinence we have decided to focus on the urge component 1st. Patient has already had a mid urethral sling placed which did not help her. She has failed medications. We discussed other options including Botox injections and InterStim placement. Patient is leaving for an [place] in September to help her daughter who is pregnant and has been deployed. She will be gone for about 6 months. We discussed the risks and benefits of Botox injection including infection, bleeding, urinary retention, need for repeat treatment. Patient has agreed to proceed. She will be scheduled for Botox injection in the next week or 2.   2   Urinary Urgency - R39.15 Chronic, Stable  3   Stress Incontinence - N39.3 Chronic, Stable

## 2020-05-08 ENCOUNTER — Ambulatory Visit (HOSPITAL_BASED_OUTPATIENT_CLINIC_OR_DEPARTMENT_OTHER): Payer: 59 | Admitting: Anesthesiology

## 2020-05-08 ENCOUNTER — Ambulatory Visit (HOSPITAL_BASED_OUTPATIENT_CLINIC_OR_DEPARTMENT_OTHER)
Admission: RE | Admit: 2020-05-08 | Discharge: 2020-05-08 | Disposition: A | Discharge status: Home / Self Care | Payer: 59 | Attending: Urology | Admitting: Urology

## 2020-05-08 ENCOUNTER — Encounter (HOSPITAL_BASED_OUTPATIENT_CLINIC_OR_DEPARTMENT_OTHER): Payer: Self-pay | Admitting: Urology

## 2020-05-08 ENCOUNTER — Encounter (HOSPITAL_BASED_OUTPATIENT_CLINIC_OR_DEPARTMENT_OTHER)
Admission: RE | Disposition: A | Discharge status: Home / Self Care | Payer: Self-pay | Source: Home / Self Care | Attending: Urology

## 2020-05-08 DIAGNOSIS — M1990 Unspecified osteoarthritis, unspecified site: Secondary | ICD-10-CM | POA: Diagnosis not present

## 2020-05-08 DIAGNOSIS — N3281 Overactive bladder: Secondary | ICD-10-CM | POA: Insufficient documentation

## 2020-05-08 DIAGNOSIS — N3946 Mixed incontinence: Secondary | ICD-10-CM | POA: Insufficient documentation

## 2020-05-08 DIAGNOSIS — Z87891 Personal history of nicotine dependence: Secondary | ICD-10-CM | POA: Diagnosis not present

## 2020-05-08 DIAGNOSIS — Z791 Long term (current) use of non-steroidal anti-inflammatories (NSAID): Secondary | ICD-10-CM | POA: Insufficient documentation

## 2020-05-08 HISTORY — PX: CYSTOSCOPY: SHX5120

## 2020-05-08 HISTORY — DX: Anemia, unspecified: D64.9

## 2020-05-08 HISTORY — DX: Overactive bladder: N32.81

## 2020-05-08 HISTORY — DX: Unspecified osteoarthritis, unspecified site: M19.90

## 2020-05-08 HISTORY — DX: Headache, unspecified: R51.9

## 2020-05-08 HISTORY — PX: BOTOX INJECTION: SHX5754

## 2020-05-08 SURGERY — CYSTOSCOPY
Anesthesia: General

## 2020-05-08 MED ORDER — ONDANSETRON HCL 4 MG/2ML IJ SOLN
INTRAMUSCULAR | Status: AC
  Filled 2020-05-08: qty 2

## 2020-05-08 MED ORDER — LIDOCAINE 2% (20 MG/ML) 5 ML SYRINGE
INTRAMUSCULAR | Status: AC
  Filled 2020-05-08: qty 5

## 2020-05-08 MED ORDER — ACETAMINOPHEN 500 MG PO TABS: 1000.0000 mg | ORAL_TABLET | Freq: Once | ORAL | Status: DC | PRN

## 2020-05-08 MED ORDER — MIDAZOLAM HCL 5 MG/5ML IJ SOLN
INTRAMUSCULAR | Status: DC | PRN
  Administered 2020-05-08: 2 mg via INTRAVENOUS

## 2020-05-08 MED ORDER — ONABOTULINUMTOXINA 100 UNITS IJ SOLR
INTRAMUSCULAR | Status: DC | PRN
  Administered 2020-05-08: 100 [IU] via INTRAMUSCULAR

## 2020-05-08 MED ORDER — FENTANYL CITRATE (PF) 100 MCG/2ML IJ SOLN: 25.0000 ug | INTRAMUSCULAR | Status: DC | PRN

## 2020-05-08 MED ORDER — CEFAZOLIN SODIUM-DEXTROSE 2-3 GM-%(50ML) IV SOLR
INTRAVENOUS | Status: DC | PRN
  Administered 2020-05-08: 2 g via INTRAVENOUS

## 2020-05-08 MED ORDER — LACTATED RINGERS IV SOLN: INTRAVENOUS | Status: DC

## 2020-05-08 MED ORDER — LIDOCAINE 2% (20 MG/ML) 5 ML SYRINGE
INTRAMUSCULAR | Status: DC | PRN
  Administered 2020-05-08 (×2): 40 mg via INTRAVENOUS

## 2020-05-08 MED ORDER — ONDANSETRON HCL 4 MG/2ML IJ SOLN
INTRAMUSCULAR | Status: DC | PRN
  Administered 2020-05-08: 4 mg via INTRAVENOUS

## 2020-05-08 MED ORDER — DEXAMETHASONE SODIUM PHOSPHATE 10 MG/ML IJ SOLN
INTRAMUSCULAR | Status: AC
  Filled 2020-05-08: qty 1

## 2020-05-08 MED ORDER — ACETAMINOPHEN 10 MG/ML IV SOLN: 1000.0000 mg | Freq: Once | INTRAVENOUS | Status: DC | PRN

## 2020-05-08 MED ORDER — ACETAMINOPHEN 160 MG/5ML PO SOLN: 1000.0000 mg | Freq: Once | ORAL | Status: DC | PRN

## 2020-05-08 MED ORDER — FENTANYL CITRATE (PF) 100 MCG/2ML IJ SOLN
INTRAMUSCULAR | Status: AC
  Filled 2020-05-08: qty 2

## 2020-05-08 MED ORDER — MIDAZOLAM HCL 2 MG/2ML IJ SOLN
INTRAMUSCULAR | Status: AC
  Filled 2020-05-08: qty 2

## 2020-05-08 MED ORDER — DEXAMETHASONE SODIUM PHOSPHATE 4 MG/ML IJ SOLN
INTRAMUSCULAR | Status: DC | PRN
  Administered 2020-05-08: 5 mg via INTRAVENOUS

## 2020-05-08 MED ORDER — PROPOFOL 10 MG/ML IV BOLUS
INTRAVENOUS | Status: DC | PRN
  Administered 2020-05-08: 150 mg via INTRAVENOUS

## 2020-05-08 MED ORDER — FENTANYL CITRATE (PF) 100 MCG/2ML IJ SOLN
INTRAMUSCULAR | Status: DC | PRN
  Administered 2020-05-08 (×4): 25 ug via INTRAVENOUS

## 2020-05-08 MED ORDER — PROPOFOL 10 MG/ML IV BOLUS
INTRAVENOUS | Status: AC
  Filled 2020-05-08: qty 20

## 2020-05-08 MED ORDER — OXYCODONE HCL 5 MG PO TABS: 5.0000 mg | ORAL_TABLET | Freq: Once | ORAL | Status: DC | PRN

## 2020-05-08 MED ORDER — CEFAZOLIN SODIUM-DEXTROSE 2-4 GM/100ML-% IV SOLN
INTRAVENOUS | Status: AC
  Filled 2020-05-08: qty 100

## 2020-05-08 MED ORDER — SODIUM CHLORIDE (PF) 0.9 % IJ SOLN
INTRAMUSCULAR | Status: DC | PRN
  Administered 2020-05-08: 20 mL

## 2020-05-08 MED ORDER — CIPROFLOXACIN IN D5W 400 MG/200ML IV SOLN: 400.0000 mg | INTRAVENOUS | Status: DC

## 2020-05-08 MED ORDER — OXYCODONE HCL 5 MG/5ML PO SOLN: 5.0000 mg | Freq: Once | ORAL | Status: DC | PRN

## 2020-05-08 MED ORDER — CIPROFLOXACIN IN D5W 400 MG/200ML IV SOLN
INTRAVENOUS | Status: AC
  Filled 2020-05-08: qty 200

## 2020-05-08 SURGICAL SUPPLY — 15 items
BAG DRAIN URO-CYSTO SKYTR STRL (DRAIN) ×2 IMPLANT
BAG DRN UROCATH (DRAIN) ×1
CLOTH BEACON ORANGE TIMEOUT ST (SAFETY) ×2 IMPLANT
ELECT REM PT RETURN 9FT ADLT (ELECTROSURGICAL)
ELECTRODE REM PT RTRN 9FT ADLT (ELECTROSURGICAL) IMPLANT
GLOVE BIO SURGEON STRL SZ 6.5 (GLOVE) ×2 IMPLANT
GOWN STRL REUS W/TWL LRG LVL3 (GOWN DISPOSABLE) ×2 IMPLANT
KIT TURNOVER CYSTO (KITS) ×2 IMPLANT
MANIFOLD NEPTUNE II (INSTRUMENTS) ×2 IMPLANT
NEEDLE ASPIRATION 22 (NEEDLE) ×2 IMPLANT
PACK CYSTO (CUSTOM PROCEDURE TRAY) ×2 IMPLANT
SYR 20ML LL LF (SYRINGE) ×2 IMPLANT
SYR CONTROL 10ML LL (SYRINGE) ×2 IMPLANT
TUBE CONNECTING 12X1/4 (SUCTIONS) ×2 IMPLANT
TUBING UROLOGY SET (TUBING) IMPLANT

## 2020-05-08 NOTE — Op Note (Signed)
Operative Note  Preoperative diagnosis:  1.  Detrusor overactivity with urge incontinence  Postoperative diagnosis: 1.  Detrusor overactivity with urge incontinence  Procedure(s): 1.  Cystoscopy with 100 units Botox injection  Surgeon: Kasandra Knudsen, MD  Assistants:  None  Anesthesia:  General  Complications:  None  EBL:  minimal  Specimens: 1. none  Drains/Catheters: 1.  none  Intraoperative findings:   1. Normal urethra 2. Bladder mucosa normal without masses or stones 3. Orthotopic bilateral ureteral orifices 4. 100 units of Botox injected in 20 injections of 42mL template  Indication:  Donna [name] is a 54 y.o. female with detrusor overactivity with urinary urge incontinence.  She has failed pharmacologic intervention.  Urodynamics consistent with unstable contractions.  Description of procedure:  After the risks and benefits of the procedure were discussed with the patient, informed consent was obtained.  The patient was taken back to the operating room placed in the supine position.  Anesthesia was induced antibiotics administered.  The patient was repositioned in dorsolithotomy position she was prepped and draped in usual sterile fashion time was performed.   The rigid cystoscope was then placed in the urethral meatus and advanced in the bladder.  Findings are noted above.  100 units of Botox were then injected using 20 - 1 mL injections on the posterior wall the bladder.  Care was taken to avoid the ureteral orifices.  These were identified at the start and end of the case.  The patient's bladder was decompressed and the cystoscope was removed.    The patient emerged from anesthesia without difficulty.  She was transferred the PACU in stable condition  Plan: [place] and will follow up in 6 months when she returns.

## 2020-05-08 NOTE — Interval H&P Note (Signed)
History and Physical Interval Note:  05/08/2020 9:08 AM  Donna [name]  has presented today for surgery, with the diagnosis of OVERACTIVE BLADDER.  The various methods of treatment have been discussed with the patient and family. After consideration of risks, benefits and other options for treatment, the patient has consented to  Procedure(s) with comments: CYSTOSCOPY (N/A) - 30 MINS BOTOX INJECTION (N/A) as a surgical intervention.  The patient's history has been reviewed, patient examined, no change in status, stable for surgery.  I have reviewed the patient's chart and labs.  Questions were answered to the patient's satisfaction.     [name] D [name]

## 2020-05-08 NOTE — Discharge Instructions (Signed)
Cystoscopy with Botox patient instructions  You may have bloody urine for two to three days (Call your doctor if the amount of bleeding increases or does not subside).  You may pass blood clots in your urine, especially if you had a biopsy. It is not unusual to pass small blood clots and have some bloody urine a couple of weeks after your cystoscopy. Again, call your doctor if the bleeding does not subside. You may have: Dysuria (painful urination) Frequency (urinating often) Urgency (strong desire to urinate)  These symptoms are common especially if medicine is instilled into the bladder or a ureteral stent is placed. Avoiding alcohol and caffeine, such as coffee, tea, and chocolate, may help relieve these symptoms. Drink plenty of water, unless otherwise instructed. Your doctor may also prescribe an antibiotic or other medicine to reduce these symptoms.  Cystoscopy results are available soon after the procedure; biopsy results usually take two to four days. Your doctor will discuss the results of your exam with you. Before you go home, you will be given specific instructions for follow-up care. Special Instructions:  1 If you are going home with a catheter in place do not take a tub bath until removed by your doctor.  2 You may resume your normal activities.  3 Do not drive or operate machinery if you are taking narcotic pain medicine.  4 Be sure to keep all follow-up appointments with your doctor.   5 Call Your Doctor If: The catheter is not draining  You have severe pain  You are unable to urinate  You have a fever over 101  You have severe bleeding    Post Anesthesia Home Care Instructions  Activity: Get plenty of rest for the remainder of the day. A responsible individual must stay with you for 24 hours following the procedure.  For the next 24 hours, DO NOT: -Drive a car -Advertising copywriter -Drink alcoholic beverages -Take any medication unless instructed by your  physician -Make any legal decisions or sign important papers.  Meals: Start with liquid foods such as gelatin or soup. Progress to regular foods as tolerated. Avoid greasy, spicy, heavy foods. If nausea and/or vomiting occur, drink only clear liquids until the nausea and/or vomiting subsides. Call your physician if vomiting continues.  Special Instructions/Symptoms: Your throat may feel dry or sore from the anesthesia or the breathing tube placed in your throat during surgery. If this causes discomfort, gargle with warm salt water. The discomfort should disappear within 24 hours.

## 2020-05-08 NOTE — Transfer of Care (Signed)
Immediate Anesthesia Transfer of Care Note  Patient: Donna [name]  Procedure(s) Performed: Procedure(s) (LRB): CYSTOSCOPY (N/A) BOTOX INJECTION (N/A)  Patient Location: PACU  Anesthesia Type: General  Level of Consciousness: awake, sedated, patient cooperative and responds to stimulation  Airway & Oxygen Therapy: Patient Spontanous Breathing and Patient connected to [place] 02 and soft FM   Post-op Assessment: Report given to PACU RN, Post -op Vital signs reviewed and stable and Patient moving all extremities  Post vital signs: Reviewed and stable  Complications: No apparent anesthesia complications

## 2020-05-08 NOTE — Anesthesia Procedure Notes (Signed)
Procedure Name: LMA Insertion Date/Time: 05/08/2020 11:00 AM Performed by: Jessica Priest, CRNA Pre-anesthesia Checklist: Patient identified, Emergency Drugs available, Suction available, Patient being monitored and Timeout performed Patient Re-evaluated:Patient Re-evaluated prior to induction Oxygen Delivery Method: Circle system utilized Preoxygenation: Pre-oxygenation with 100% oxygen Induction Type: IV induction Ventilation: Mask ventilation without difficulty LMA: LMA inserted LMA Size: 4.0 Number of attempts: 1 Airway Equipment and Method: Bite block Placement Confirmation: positive ETCO2,  breath sounds checked- equal and bilateral and CO2 detector Tube secured with: Tape Dental Injury: Teeth and Oropharynx as per pre-operative assessment

## 2020-05-08 NOTE — Anesthesia Preprocedure Evaluation (Signed)
Anesthesia Evaluation  Patient identified by MRN, date of birth, ID band Patient awake    Reviewed: Allergy & Precautions, NPO status , Patient's Chart, lab work & pertinent test results  History of Anesthesia Complications Negative for: history of anesthetic complications  Airway Mallampati: II  TM Distance: >3 FB Neck ROM: Full    Dental  (+) Dental Advisory Given, Edentulous Upper, Edentulous Lower   Pulmonary neg pulmonary ROS, neg recent URI, former smoker,  Covid-19 Nucleic Acid Test Results Lab Results      Component                Value               Date                      [id number]              NEGATIVE            05/05/2020              breath sounds clear to auscultation       Cardiovascular negative cardio ROS   Rhythm:Regular     Neuro/Psych  Headaches,  Neuromuscular disease negative psych ROS   GI/Hepatic Neg liver ROS, GERD  Controlled and Medicated,  Endo/Other  negative endocrine ROS  Renal/GU negative Renal ROS  negative genitourinary   Musculoskeletal  (+) Arthritis ,   Abdominal   Peds  Hematology negative hematology ROS (+)   Anesthesia Other Findings   Reproductive/Obstetrics negative OB ROS                             Anesthesia Physical Anesthesia Plan  ASA: II  Anesthesia Plan: General   Post-op Pain Management:    Induction: Intravenous  PONV Risk Score and Plan: 3 and Ondansetron and Dexamethasone  Airway Management Planned: LMA  Additional Equipment: None  Intra-op Plan:   Post-operative Plan: Extubation in OR  Informed Consent: I have reviewed the patients History and Physical, chart, labs and discussed the procedure including the risks, benefits and alternatives for the proposed anesthesia with the patient or authorized representative who has indicated his/her understanding and acceptance.     Dental advisory given  Plan Discussed  with: CRNA and Surgeon  Anesthesia Plan Comments:         Anesthesia Quick Evaluation

## 2020-05-09 ENCOUNTER — Encounter (HOSPITAL_BASED_OUTPATIENT_CLINIC_OR_DEPARTMENT_OTHER): Payer: Self-pay | Admitting: Urology

## 2020-05-10 ENCOUNTER — Other Ambulatory Visit: Payer: Self-pay

## 2020-05-10 ENCOUNTER — Ambulatory Visit (INDEPENDENT_AMBULATORY_CARE_PROVIDER_SITE_OTHER): Payer: 59 | Admitting: Podiatry

## 2020-05-10 ENCOUNTER — Encounter: Payer: Self-pay | Admitting: Podiatry

## 2020-05-10 DIAGNOSIS — M722 Plantar fascial fibromatosis: Secondary | ICD-10-CM | POA: Diagnosis not present

## 2020-05-10 MED ORDER — CELECOXIB 100 MG PO CAPS: 100.0000 mg | ORAL_CAPSULE | Freq: Two times a day (BID) | ORAL | 3 refills | Status: AC

## 2020-05-10 NOTE — Progress Notes (Signed)
Presents today for follow-up of plantar fasciitis bilateral heel is not as bad as it was but it still hurts to some degree.  Objective: Vital signs stable oriented x3 pain on palpation medial calcaneal tubercles bilateral.  Assessment: Plantar fasciitis bilateral.  Plan: Reinjected the bilateral heels today follow-up with [place] in a few weeks.

## 2020-05-11 NOTE — Anesthesia Postprocedure Evaluation (Signed)
Anesthesia Post Note  Patient: Donna [name]  Procedure(s) Performed: CYSTOSCOPY (N/A ) BOTOX INJECTION (N/A )     Patient location during evaluation: PACU Anesthesia Type: General Level of consciousness: awake and alert Pain management: pain level controlled Vital Signs Assessment: post-procedure vital signs reviewed and stable Respiratory status: spontaneous breathing, nonlabored ventilation, respiratory function stable and patient connected to nasal cannula oxygen Cardiovascular status: blood pressure returned to baseline and stable Postop Assessment: no apparent nausea or vomiting Anesthetic complications: no   No complications documented.  Last Vitals:  Vitals:   05/08/20 1200 05/08/20 1238  BP: (!) 145/62 (!) 150/72  Pulse: (!) 43 (!) 54  Resp: 14 18  Temp: (!) 36.4 C 36.4 C  SpO2: 95% 95%    Last Pain:  Vitals:   05/09/20 1030  TempSrc:   PainSc: 2                  [name]

## 2020-11-16 ENCOUNTER — Encounter: Payer: 59 | Attending: Physical Medicine and Rehabilitation | Admitting: Physical Medicine and Rehabilitation

## 2022-01-14 ENCOUNTER — Emergency Department (HOSPITAL_BASED_OUTPATIENT_CLINIC_OR_DEPARTMENT_OTHER)
Admission: EM | Admit: 2022-01-14 | Discharge: 2022-01-15 | Disposition: A | Discharge status: Home / Self Care | Payer: Managed Care, Other (non HMO) | Attending: Emergency Medicine | Admitting: Emergency Medicine

## 2022-01-14 ENCOUNTER — Encounter (HOSPITAL_BASED_OUTPATIENT_CLINIC_OR_DEPARTMENT_OTHER): Payer: Self-pay

## 2022-01-14 ENCOUNTER — Emergency Department (HOSPITAL_BASED_OUTPATIENT_CLINIC_OR_DEPARTMENT_OTHER): Payer: Managed Care, Other (non HMO)

## 2022-01-14 ENCOUNTER — Other Ambulatory Visit: Payer: Self-pay

## 2022-01-14 DIAGNOSIS — R1084 Generalized abdominal pain: Secondary | ICD-10-CM | POA: Diagnosis not present

## 2022-01-14 DIAGNOSIS — M545 Low back pain, unspecified: Secondary | ICD-10-CM | POA: Diagnosis not present

## 2022-01-14 DIAGNOSIS — S3992XA Unspecified injury of lower back, initial encounter: Secondary | ICD-10-CM | POA: Diagnosis present

## 2022-01-14 DIAGNOSIS — W19XXXD Unspecified fall, subsequent encounter: Secondary | ICD-10-CM

## 2022-01-14 DIAGNOSIS — W010XXA Fall on same level from slipping, tripping and stumbling without subsequent striking against object, initial encounter: Secondary | ICD-10-CM | POA: Insufficient documentation

## 2022-01-14 DIAGNOSIS — S3210XA Unspecified fracture of sacrum, initial encounter for closed fracture: Secondary | ICD-10-CM | POA: Diagnosis not present

## 2022-01-14 DIAGNOSIS — R109 Unspecified abdominal pain: Secondary | ICD-10-CM

## 2022-01-14 LAB — URINALYSIS, ROUTINE W REFLEX MICROSCOPIC
Bilirubin Urine: NEGATIVE
Glucose, UA: NEGATIVE mg/dL
Hgb urine dipstick: NEGATIVE
Ketones, ur: NEGATIVE mg/dL
Leukocytes,Ua: NEGATIVE
Nitrite: NEGATIVE
Protein, ur: NEGATIVE mg/dL
Specific Gravity, Urine: 1.015 (ref 1.005–1.030)
pH: 7 (ref 5.0–8.0)

## 2022-01-14 LAB — CBC
HCT: 39.9 % (ref 36.0–46.0)
Hemoglobin: 13.5 g/dL (ref 12.0–15.0)
MCH: 28.8 pg (ref 26.0–34.0)
MCHC: 33.8 g/dL (ref 30.0–36.0)
MCV: 85.3 fL (ref 80.0–100.0)
Platelets: 455 10*3/uL — ABNORMAL HIGH (ref 150–400)
RBC: 4.68 MIL/uL (ref 3.87–5.11)
RDW: 11.6 % (ref 11.5–15.5)
WBC: 10.7 10*3/uL — ABNORMAL HIGH (ref 4.0–10.5)
nRBC: 0 % (ref 0.0–0.2)

## 2022-01-14 LAB — COMPREHENSIVE METABOLIC PANEL
ALT: 17 U/L (ref 0–44)
AST: 14 U/L — ABNORMAL LOW (ref 15–41)
Albumin: 4.1 g/dL (ref 3.5–5.0)
Alkaline Phosphatase: 76 U/L (ref 38–126)
Anion gap: 10 (ref 5–15)
BUN: 9 mg/dL (ref 6–20)
CO2: 24 mmol/L (ref 22–32)
Calcium: 9.1 mg/dL (ref 8.9–10.3)
Chloride: 105 mmol/L (ref 98–111)
Creatinine, Ser: 0.58 mg/dL (ref 0.44–1.00)
GFR, Estimated: >60 mL/min (ref >60)
Glucose, Bld: 100 mg/dL — ABNORMAL HIGH (ref 70–99)
Potassium: 3.6 mmol/L (ref 3.5–5.1)
Sodium: 139 mmol/L (ref 135–145)
Total Bilirubin: 0.5 mg/dL (ref 0.3–1.2)
Total Protein: 7.5 g/dL (ref 6.5–8.1)

## 2022-01-14 LAB — LIPASE, BLOOD: Lipase: 32 U/L (ref 11–51)

## 2022-01-14 IMAGING — CT CT ABD-PELV W/ CM
2 of 8 series · 15 of 46 positions shown, 17 images · IV contrast (Omnipaque)
Comparison: CT with IV contrast [DATE]

CLINICAL DATA: The patient fell down at a restaurant 4 days ago
with continued complaints of back pain, abdominal pain and nausea
since, loss of appetite and urinary hesitancy.

EXAM:
CT ABDOMEN AND PELVIS WITH CONTRAST
TECHNIQUE: Multidetector CT imaging of the abdomen and pelvis was performed
using the standard protocol following bolus administration of
intravenous contrast.

[Series 5: axial st · axial · 0.80mm/px · z∈[+789,+1134]mm · 12 of 79 slices shown, 14 images]
[im 5/79  soft-tissue]
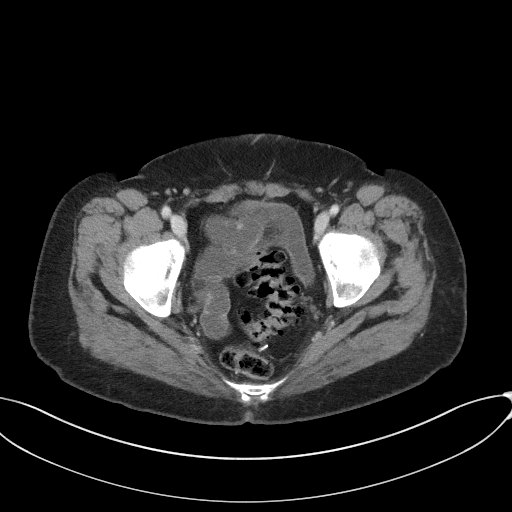
[im 5/79  bone]
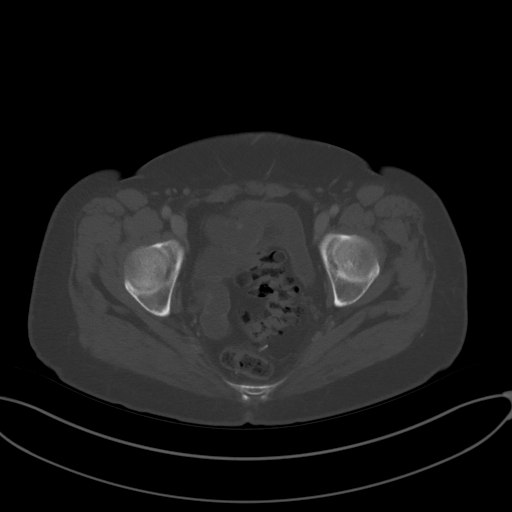
[im 10/79  soft-tissue]
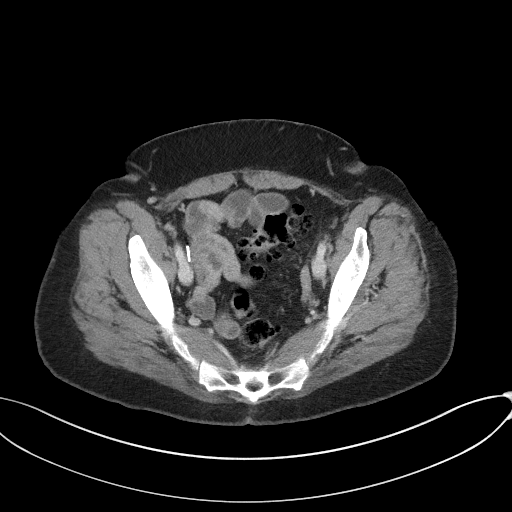
[im 20/79  soft-tissue]
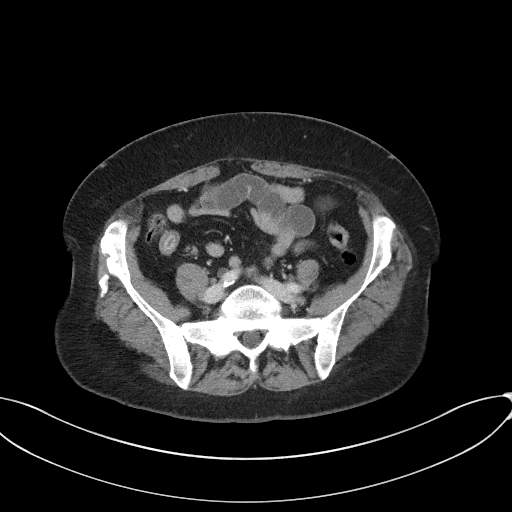
[im 25/79  soft-tissue]
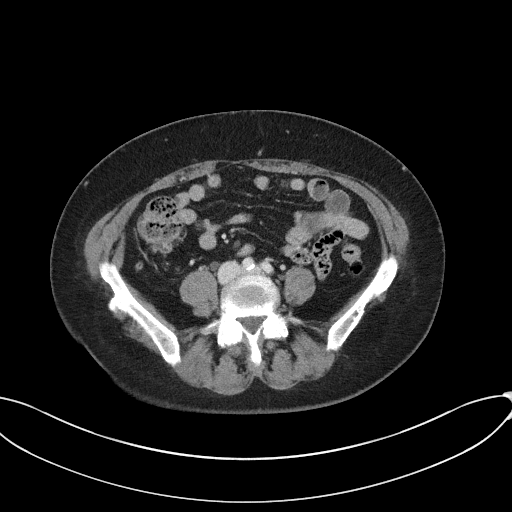
[im 30/79  soft-tissue]
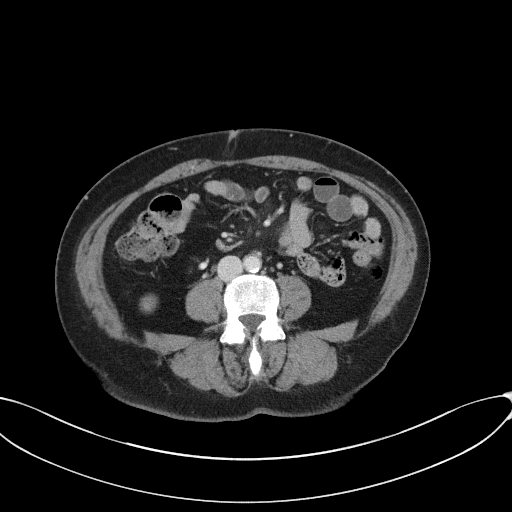
[im 35/79  soft-tissue]
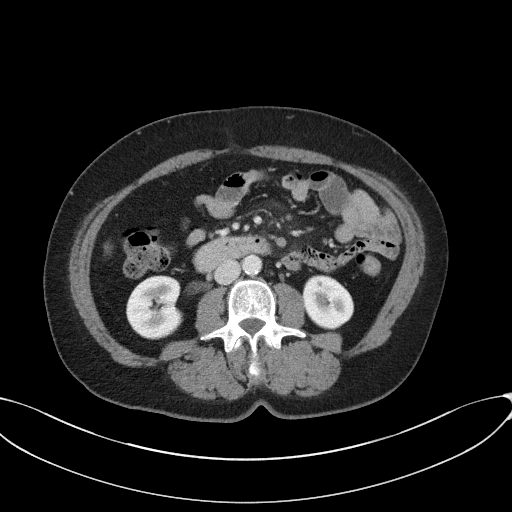
[im 44/79  soft-tissue]
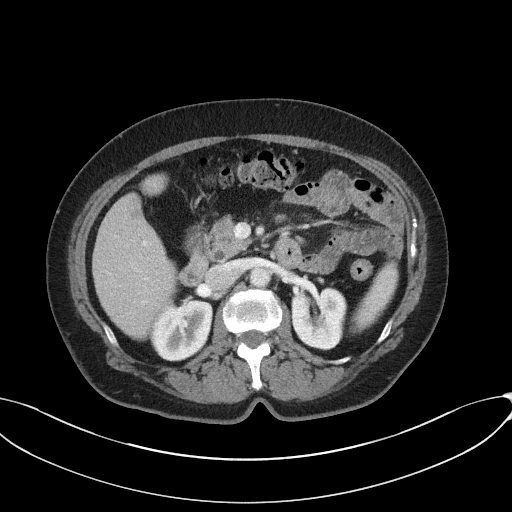
[im 49/79  soft-tissue]
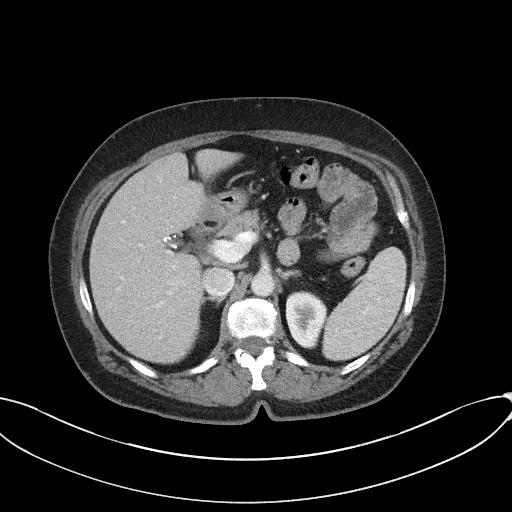
[im 54/79  soft-tissue]
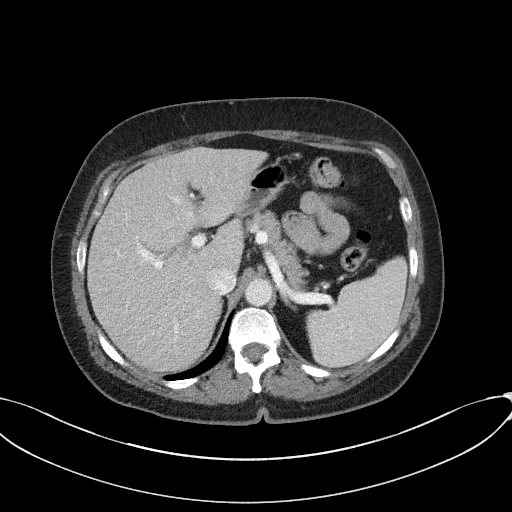
[im 54/79  bone]
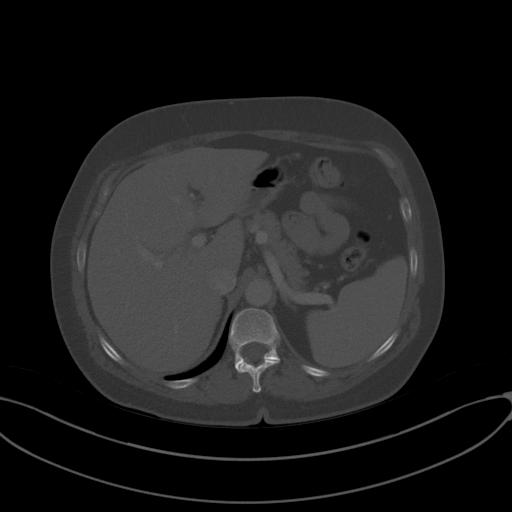
[im 59/79  soft-tissue]
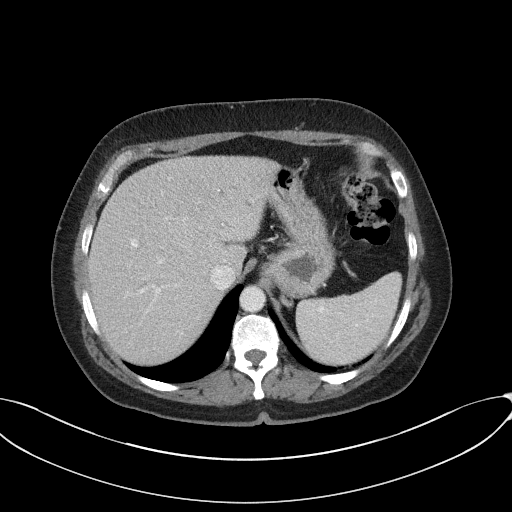
[im 69/79  soft-tissue]
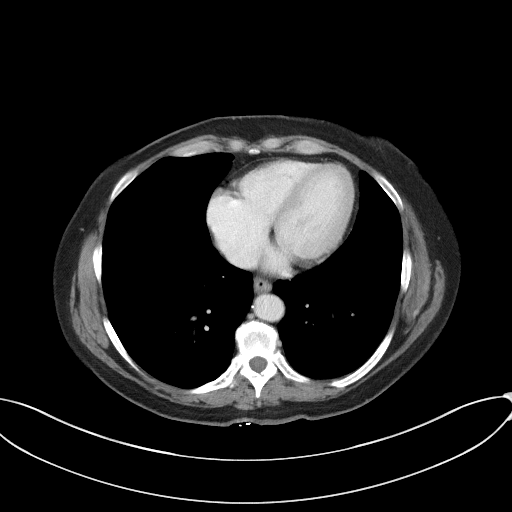
[im 74/79  soft-tissue]
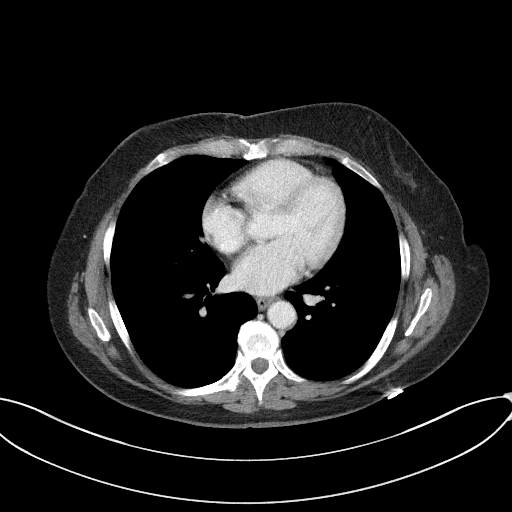

[Series 6: coronal st · coronal · 0.74mm/px · 3 of 100 slices shown]
[im 25/100  soft-tissue]
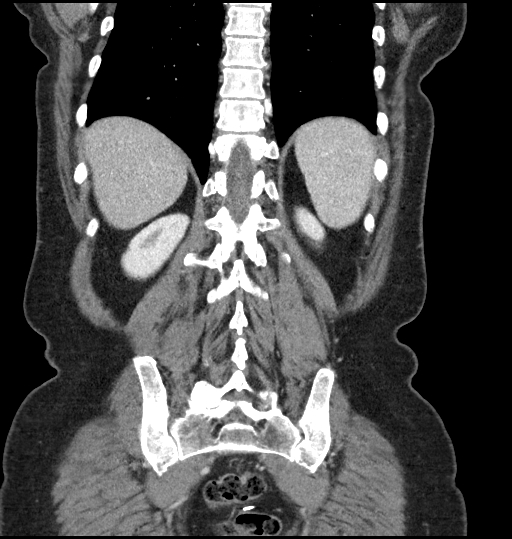
[im 50/100  soft-tissue]
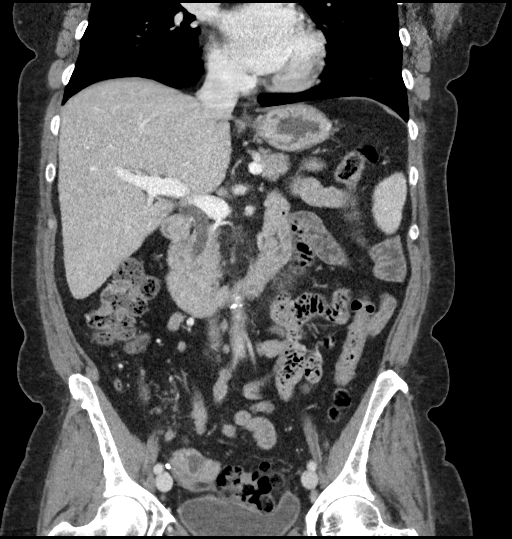
[im 75/100  soft-tissue]
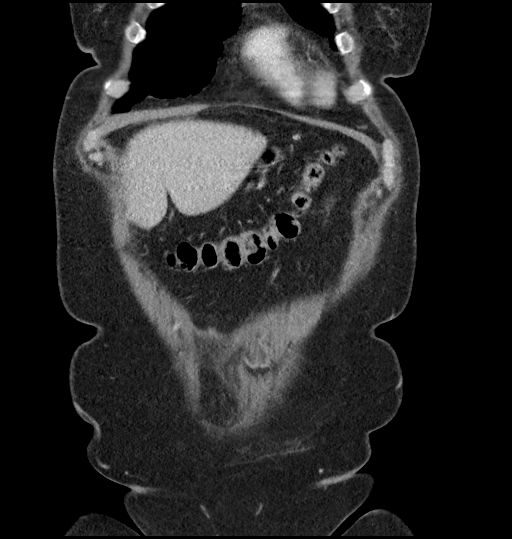

[15 of 46 positions shown; findings below may reference images not displayed]

RADIATION DOSE REDUCTION: This exam was performed according to the
departmental dose-optimization program which includes automated
exposure control, adjustment of the mA and/or kV according to
patient size and/or use of iterative reconstruction technique.

CONTRAST:  100mL OMNIPAQUE IOHEXOL 300 MG/ML  SOLN
FINDINGS: Lower chest: No acute abnormality.

Hepatobiliary: No focal liver abnormality is seen. Status post
cholecystectomy. No biliary dilatation.

Pancreas: Unremarkable.

Spleen: Unremarkable.  No splenomegaly.

Adrenals/Urinary Tract: There is no adrenal or renal cortical mass,
no urinary stone or obstruction, no significant thickening of the
bladder.

Stomach/Bowel: No dilatation or wall thickening including the
retrocecal appendix. There is colonic diverticulosis most advanced
in the distal descending and sigmoid segments, but no findings of
acute diverticulitis or colitis.

Vascular/Lymphatic: There is patchy aortic atherosclerosis without
aneurysm, dissection or occlusion. Upper-normal portal vein caliber
is unchanged.

Haziness persists in the mesenteric root fat which contains multiple
borderline to slightly prominent lymph nodes up to 1 cm in short
axis with no interval progression in the findings. There is no
further adenopathy.

Reproductive: The uterus is absent. There was previously a 5.1 cm
left ovarian cyst which is no longer seen. No adnexal mass is
evident. There are surgical clips both pelvic sidewalls.

Other: There are small umbilical and infraumbilical midline anterior
wall fat hernias. There is no incarcerated hernia and no free fluid,
hemorrhage, free air or abscess.

Musculoskeletal: There are degenerative changes of the spine,
chronic trauma deformity of the lateral anterior right ilium and
proximal left femur, and evidence of removed hardware from the
proximal left femur as before. Mild bilateral hip DJD. No concerning
regional bone lesion.
IMPRESSION: 1. No acute abdominal or pelvic abnormality demonstrated.
2. Advanced diverticulosis distal descending and sigmoid colon
without evidence of diverticulitis.
3. Degenerative changes of the lumbar spine without spinal
compression or other visible acute fracture or malalignment.
4. Small umbilical and infraumbilical midline anterior wall fat
hernias.
5. Aortic atherosclerosis.
6. Borderline and slightly prominent mesenteric root nodes with
"misty" mesenteric root fat. Findings not significantly changed
compared with [IR] and could be due to chronic mesenteric adenitis,
mesenteric panniculitis or fibrosing mesenteritis. Other etiologies
are possible.
7. Remaining findings discussed above.

## 2022-01-14 IMAGING — CT CT L SPINE W/O CM
3 of 5 series · 11 of 33 positions shown, 12 images · IV contrast (Omnipaque)
Comparison: MRI lumbar spine [DATE].

CLINICAL DATA: Fall with low back pain.



[Series 2: lumbar axial st · axial · 0.29mm/px · z∈[+847,+1004]mm · 4 of 153 slices shown, 5 images]
[im 24/153  soft-tissue]
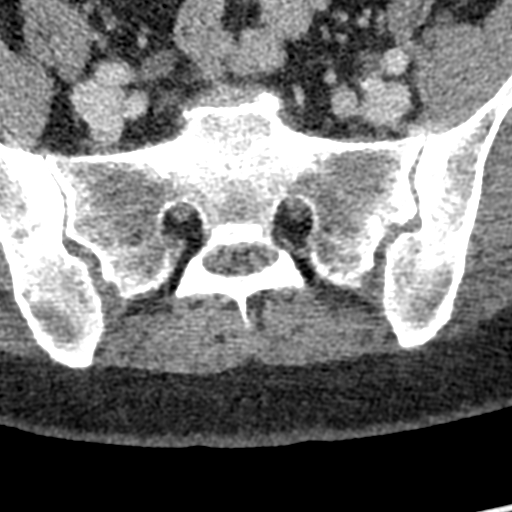
[im 24/153  bone]
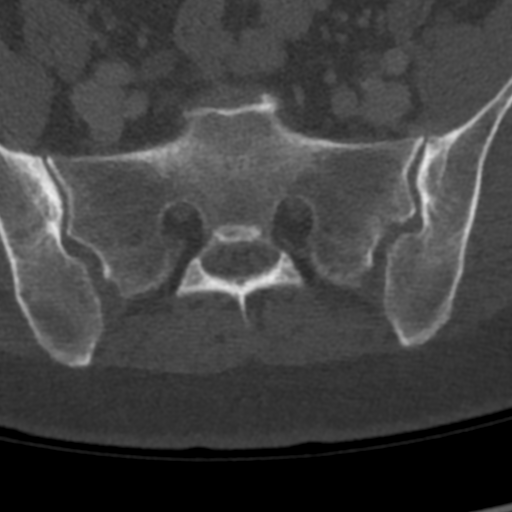
[im 59/153  bone]
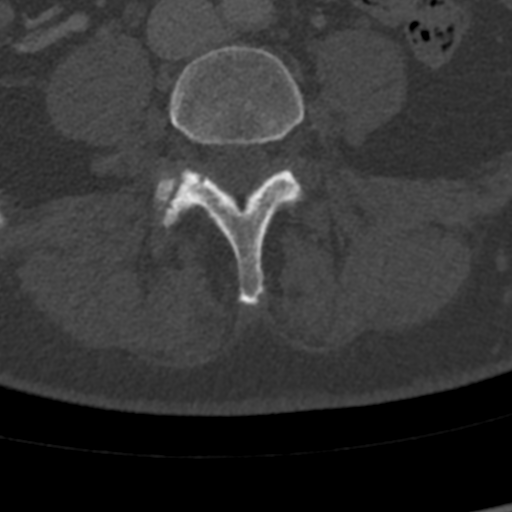
[im 94/153  bone]
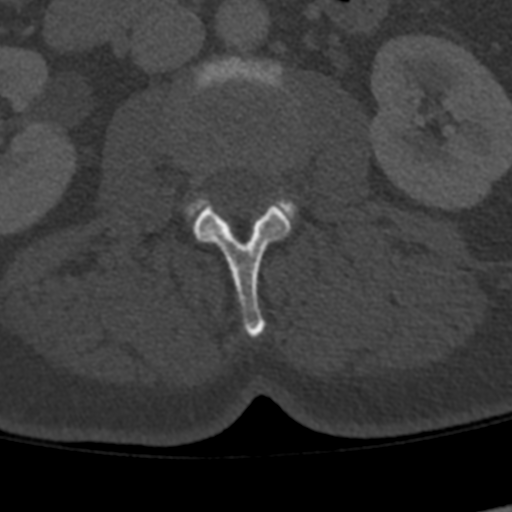
[im 129/153  bone]
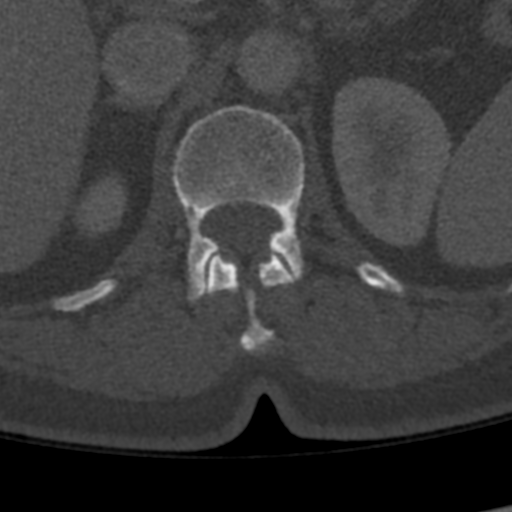

[Series 4: coronalbone lumbar · coronal · 0.31mm/px · 2 of 75 slices shown]
[im 2/75  bone]
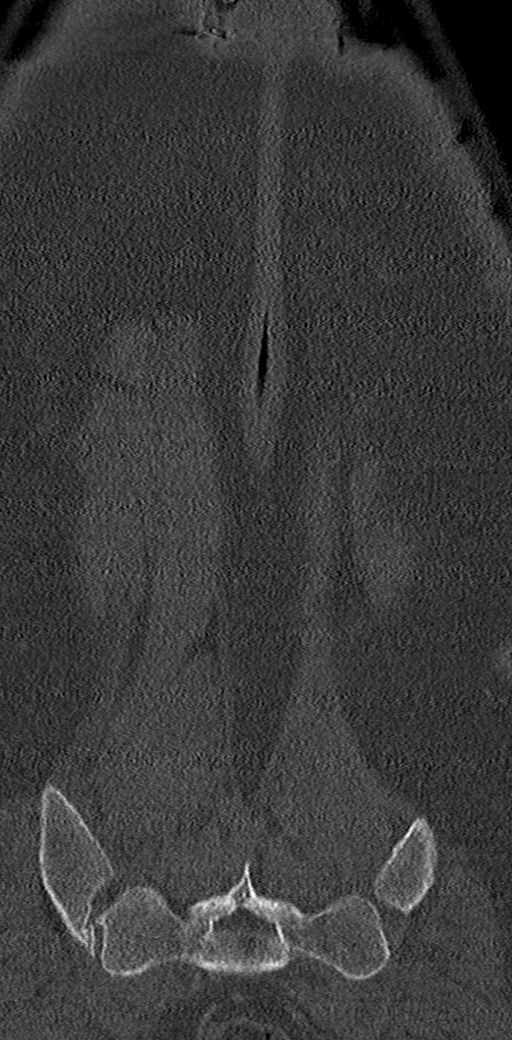
[im 38/75  bone]
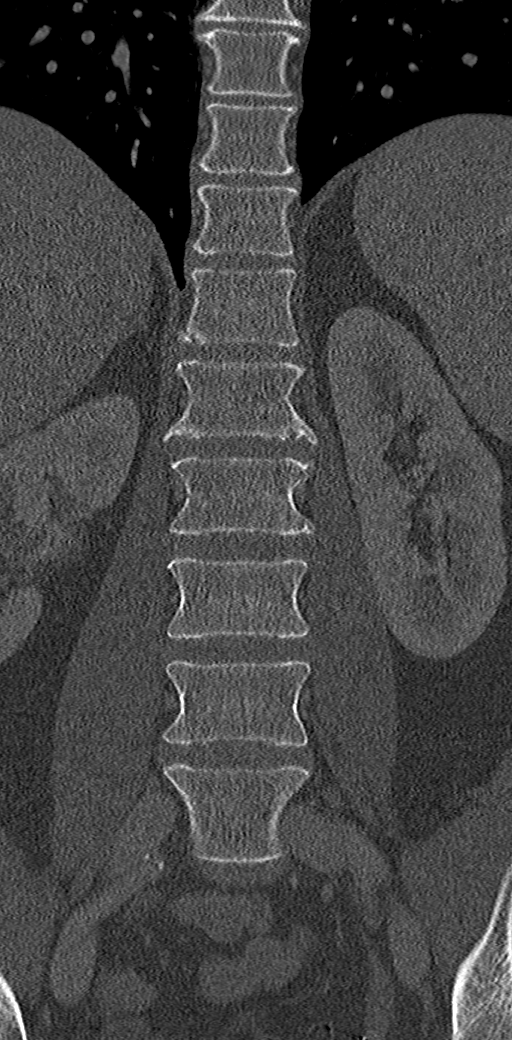

[Series 7: sagittal st · sagittal · 0.54mm/px · 5 of 101 slices shown]
[im 15/101  bone]
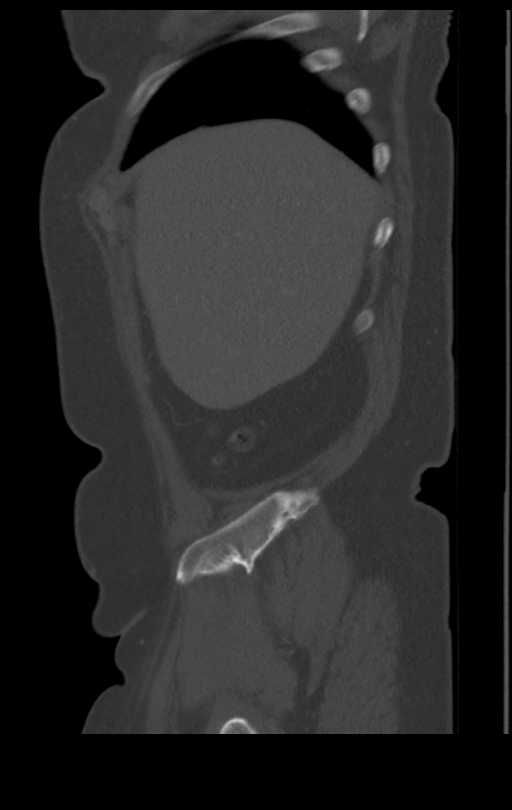
[im 29/101  bone]
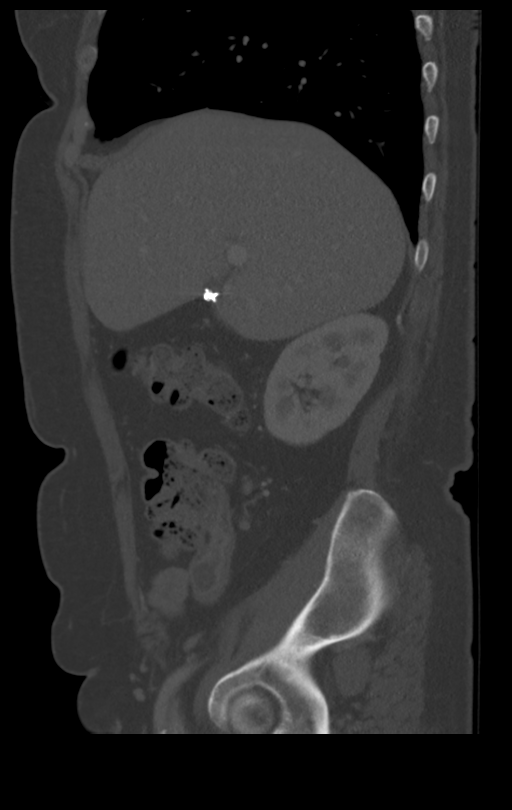
[im 43/101  bone]
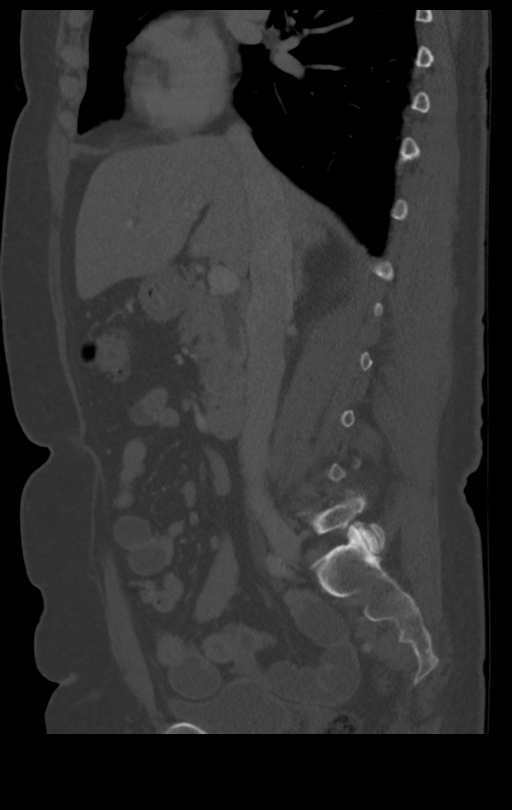
[im 58/101  bone]
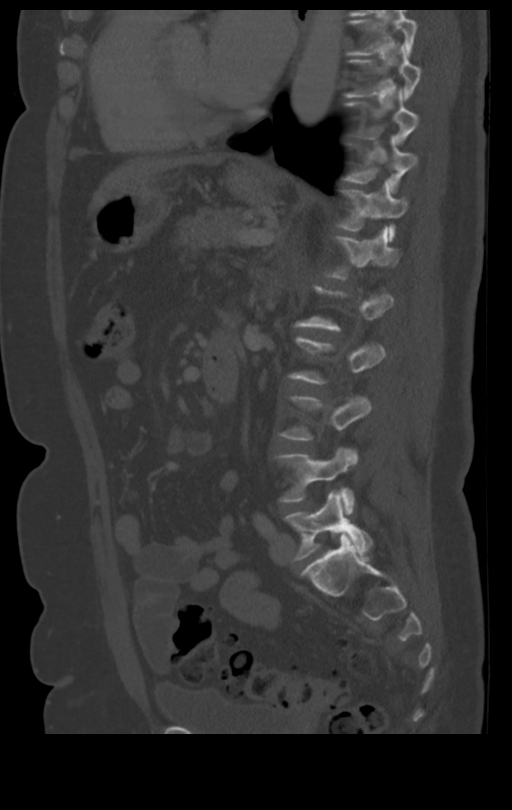
[im 72/101  bone]
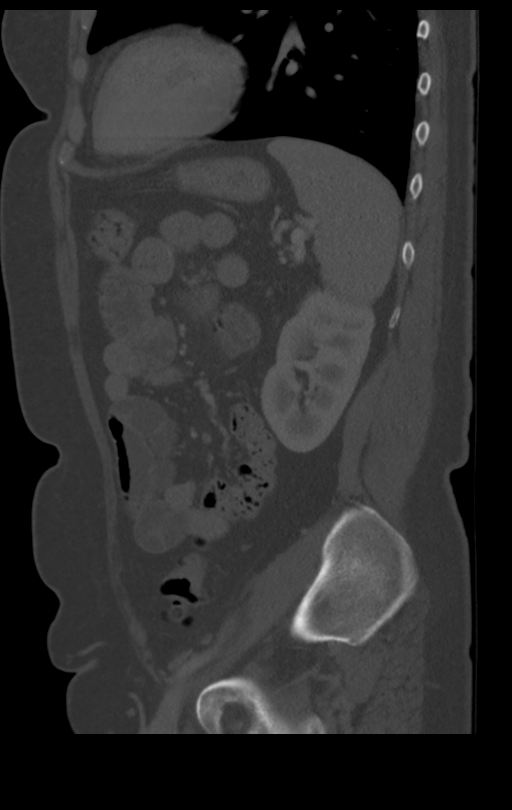

[11 of 33 positions shown; findings below may reference images not displayed]

FINDINGS: Segmentation: 5 lumbar type vertebrae.

Alignment: Minimal chronic grade 1 anterolisthesis is again noted
L4-5 and L5-S1 is most likely due to facet hypertrophy at these
levels. This is unchanged. The alignment is otherwise normal.

Vertebrae: There is preservation of the normal vertebral and disc
heights with no evidence of lumbar fracture or suspicious bone
lesion. There are small intraosseous hemangiomas in the L1 and 3
vertebral bodies and mild lumbar spondylosis is noted once again.

On the sagittal images only, there is a subtle cortical buckling in
the ventral aspect of the S3 body which could be a nondisplaced
fracture but which could not be seen on the other imaging planes,
and there was no appreciable underlying soft tissue reaction. This
was not seen on the prior MRI.

Paraspinal and other soft tissues: No mass, hematoma or acute
abnormality. Aortic atherosclerosis is seen without AAA.

Disc levels: Slight disc space loss is noted again at L4-5 with
other lumbar discs normal in heights with normal disc heights in the
visualized lower thoracic spine. No herniated discs are observed.

L4-5 and L5-S1 both demonstrate mild nonstenosing broad-based
posterior disc bulges, at L4-5 with also noted mild-to-moderate
facet spurring and mild ligamentous thickening dorsally, and at
L5-S1 with moderately advanced chronic facet hypertrophy.

Other levels do not show evidence of significant disc bulging or
facet spurring.

Foraminal stenosis is only seen at L5-S1 where it is mild and is
unchanged. Both SI joints are patent with mild spurring.

No displaced sacral fracture is seen, but please see above comments
regarding S3.
IMPRESSION: 1. Questionable ventral cortical buckling of S3, on the sagittal
images only and could represent a nondisplaced S3 sacral fracture
but may not be a recent injury because there is no underlying soft
tissue reaction. This not seen on the prior MRI.
2. Lumbar degenerative changes without evidence of lumbar fractures.
3. Slight chronic anterolisthesis L4-5 and L5-S1, most likely
related to facet hypertrophy.
4. Bulging discs at L4-5 and L5-S1 without herniations or
significant canal stenosis.
5. Aortic atherosclerosis.

## 2022-01-14 MED ORDER — MORPHINE SULFATE (PF) 4 MG/ML IV SOLN
4.0000 mg | Freq: Once | INTRAVENOUS | Status: AC
  Administered 2022-01-14: 4 mg via INTRAVENOUS
  Filled 2022-01-14: qty 1

## 2022-01-14 MED ORDER — OXYCODONE-ACETAMINOPHEN 5-325 MG PO TABS: 1.0000 | ORAL_TABLET | Freq: Four times a day (QID) | ORAL | 0 refills | Status: AC | PRN

## 2022-01-14 MED ORDER — IOHEXOL 300 MG/ML  SOLN
100.0000 mL | Freq: Once | INTRAMUSCULAR | Status: AC | PRN
  Administered 2022-01-14: 100 mL via INTRAVENOUS

## 2022-01-14 MED ORDER — ONDANSETRON 4 MG PO TBDP
4.0000 mg | ORAL_TABLET | Freq: Once | ORAL | Status: AC | PRN
  Administered 2022-01-14: 4 mg via ORAL
  Filled 2022-01-14: qty 1

## 2022-01-14 MED ORDER — METHOCARBAMOL 500 MG PO TABS: 500.0000 mg | ORAL_TABLET | Freq: Two times a day (BID) | ORAL | 0 refills | Status: AC

## 2022-01-14 NOTE — Discharge Instructions (Addendum)
Please take your printed prescriptions to pharmacy of your choosing and have it filled. Discontinue the other muscle relaxer and narcotic that you have prescribed.  ? ?Follow up with your orthopedist for further evaluation of your possible sacral fracture seen on CT scan today.  ? ?Return to the ED for any new/worsening symptoms ?

## 2022-01-14 NOTE — ED Triage Notes (Signed)
Patient presents with complaint of ground level fall at [employer] on Friday when she slipped in a liquid.  Since then, she complains of back pain.  She also complains of abdominal pain and nausea.  Complains of loss of appetite.  Patient complains of urinary hesitancy.   ?

## 2022-01-14 NOTE — ED Provider Notes (Addendum)
?[facility]ERGENC[facility] DEPARTMENT ?Provider Note ? ? ?CSN: [id number] ?Arrival date & time: 01/14/22  1926 ? ?  ? ?History ? ?Chief Complaint  ?Patient presents with  ? Back Pain  ? Nausea  ? Abdominal Pain  ? ? ?Donna [name] is a 56 y.o. female with PMHx anemia, arthritis, and hx of left hip arthroscopy who presents to the ED today with complaint of mechanical fall that occurred 5 days ago.  Patient reports that she had a mechanical fall at a [employer] on Friday when she slipped on liquid on the ground.  She states she fell directly onto her back.  She states that she was unable to get up off the ground due to pain.  She denies any head injury or loss of consciousness.  She went to [facility] ED at that time and had x-rays of her pelvis as well as CT of her left hip without acute findings.  She reports shortly afterwards she began experiencing abdominal pain, nausea, urinary hesitancy/urgency.  States that they did not assess her any further and discharge her home with hydrocodone as well as muscle relaxer.  She went to an orthopedist yesterday who was also concerned about her hip.  She states that she went to a local urgent care today and was sent here for further evaluation given abdominal pain.  He denies any trauma to her abdomen and again states that she fell directly onto her backside.  She also complains of pain along her lower back and states that no one has addressed this at all and is continues to be concerned about her hip. ? ?The history is provided by the patient and medical records.  ? ?  ? ?Home Medications ?Prior to Admission medications   ?Medication Sig Start Date End Date Taking? Authorizing Provider  ?methocarbamol (ROBAXIN) 500 MG tablet Take 1 tablet (500 mg total) by mouth 2 (two) times daily. 01/14/22  Yes Kaydan Wilhoite, PA-C  ?oxyCODONE-acetaminophen (PERCOCET/ROXICET) 5-325 MG tablet Take 1 tablet by mouth every 6 (six) hours as needed for severe pain. 01/14/22  Yes Maymunah Stegemann,  PA-C  ?acetaminophen (TYLENOL) 500 MG tablet Take 1,000 mg by mouth as needed for headache.    [provider]  ?celecoxib (CELEBREX) 100 MG capsule Take 1 capsule (100 mg total) by mouth 2 (two) times daily. 05/10/20   Hyatt, Max T, DPM  ?DULoxetine (CYMBALTA) 60 MG capsule Take 1 capsule (60 mg total) by mouth at bedtime. 05/04/20   Lovorn, Jinny Blossom, MD  ?meloxicam (MOBIC) 15 MG tablet Take 1 tablet (15 mg total) by mouth daily. 01/26/20   Hyatt, Max T, DPM  ?Omeprazole-Sodium Bicarbonate (ZEGERID PO) Take by mouth as needed.    [provider]  ?ondansetron (ZOFRAN) 4 MG tablet Take 4 mg by mouth every 8 (eight) hours as needed. 01/26/20   [provider]  ?traMADol (ULTRAM) 50 MG tablet Take 1 tablet (50 mg total) by mouth every 6 (six) hours as needed. 05/04/20   Courtney Heys, MD  ?   ? ?Allergies    ?Patient has no known allergies.   ? ?Review of Systems   ?Review of Systems  ?Constitutional:  Negative for chills and fever.  ?Gastrointestinal:  Positive for abdominal pain and nausea. Negative for constipation, diarrhea and vomiting.  ?Genitourinary:  Positive for urgency. Negative for dysuria.  ?Musculoskeletal:  Positive for back pain.  ?All other systems reviewed and are negative. ? ?Physical Exam ?Updated Vital Signs ?BP (!) 148/68 (BP  Location: Right Arm)   Pulse 65   Temp 97.9 ?F (36.6 ?C) (Oral)   Resp 16   Ht 5' (1.524 m)   Wt 65.3 kg   SpO2 97%   BMI 28.12 kg/m?  ?Physical Exam ?Vitals and nursing note reviewed.  ?Constitutional:   ?   Appearance: She is not ill-appearing or diaphoretic.  ?HENT:  ?   Head: Normocephalic and atraumatic.  ?Eyes:  ?   Conjunctiva/sclera: Conjunctivae normal.  ?Cardiovascular:  ?   Rate and Rhythm: Normal rate and regular rhythm.  ?   Heart sounds: Normal heart sounds.  ?Pulmonary:  ?   Effort: Pulmonary effort is normal.  ?   Breath sounds: Normal breath sounds. No wheezing, rhonchi or rales.  ?Abdominal:  ?   General: Bowel sounds are normal.   ?   Palpations: Abdomen is soft.  ?   Tenderness: There is generalized abdominal tenderness. There is no guarding or rebound.  ?Musculoskeletal:  ?   Cervical back: Neck supple.  ?   Comments: No C or T midline spinal TTP. + Midline and bilateral paralumbar musculature TTP. Moving all extremities without difficulty. Strength and sensation intact throughout.   ?Skin: ?   General: Skin is warm and dry.  ?Neurological:  ?   Mental Status: She is alert.  ? ? ?ED Results / Procedures / Treatments   ?Labs ?(all labs ordered are listed, but only abnormal results are displayed) ?Labs Reviewed  ?COMPREHENSIVE METABOLIC PANEL - Abnormal; Notable for the following components:  ?    Result Value  ? Glucose, Bld 100 (*)   ? AST 14 (*)   ? All other components within normal limits  ?CBC - Abnormal; Notable for the following components:  ? WBC 10.7 (*)   ? Platelets 455 (*)   ? All other components within normal limits  ?LIPASE, BLOOD  ?URINALYSIS, ROUTINE W REFLEX MICROSCOPIC  ? ? ?EKG ?None ? ?Radiology ?CT Lumbar Spine Wo Contrast ? ?Result Date: 01/14/2022 ?CLINICAL DATA:  Fall with low back pain. EXAM: CT LUMBAR SPINE WITHOUT CONTRAST TECHNIQUE: Multidetector CT imaging of the lumbar spine was performed without intravenous contrast administration. Multiplanar CT image reconstructions were also generated. RADIATION DOSE REDUCTION: This exam was performed according to the departmental dose-optimization program which includes automated exposure control, adjustment of the mA and/or kV according to patient size and/or use of iterative reconstruction technique. COMPARISON:  MRI lumbar spine 01/07/2020. FINDINGS: Segmentation: 5 lumbar type vertebrae. Alignment: Minimal chronic grade 1 anterolisthesis is again noted L4-5 and L5-S1 is most likely due to facet hypertrophy at these levels. This is unchanged. The alignment is otherwise normal. Vertebrae: There is preservation of the normal vertebral and disc heights with no evidence of  lumbar fracture or suspicious bone lesion. There are small intraosseous hemangiomas in the L1 and 3 vertebral bodies and mild lumbar spondylosis is noted once again. On the sagittal images only, there is a subtle cortical buckling in the ventral aspect of the S3 body which could be a nondisplaced fracture but which could not be seen on the other imaging planes, and there was no appreciable underlying soft tissue reaction. This was not seen on the prior MRI. Paraspinal and other soft tissues: No mass, hematoma or acute abnormality. Aortic atherosclerosis is seen without AAA. Disc levels: Slight disc space loss is noted again at L4-5 with other lumbar discs normal in heights with normal disc heights in the visualized lower thoracic spine. No herniated discs are observed.  L4-5 and L5-S1 both demonstrate mild nonstenosing broad-based posterior disc bulges, at L4-5 with also noted mild-to-moderate facet spurring and mild ligamentous thickening dorsally, and at L5-S1 with moderately advanced chronic facet hypertrophy. Other levels do not show evidence of significant disc bulging or facet spurring. Foraminal stenosis is only seen at L5-S1 where it is mild and is unchanged. Both SI joints are patent with mild spurring. No displaced sacral fracture is seen, but please see above comments regarding S3. IMPRESSION: 1. Questionable ventral cortical buckling of S3, on the sagittal images only and could represent a nondisplaced S3 sacral fracture but may not be a recent injury because there is no underlying soft tissue reaction. This not seen on the prior MRI. 2. Lumbar degenerative changes without evidence of lumbar fractures. 3. Slight chronic anterolisthesis L4-5 and L5-S1, most likely related to facet hypertrophy. 4. Bulging discs at L4-5 and L5-S1 without herniations or significant canal stenosis. 5. Aortic atherosclerosis. Electronically Signed   By: Telford Nab M.D.   On: 01/14/2022 22:44  ? ?CT Abdomen Pelvis W  Contrast ? ?Result Date: 01/14/2022 ?CLINICAL DATA:  The patient fell down at a restaurant 4 days ago with continued complaints of back pain, abdominal pain and nausea since, loss of appetite and urinary hesitancy. EXA

## 2022-01-14 NOTE — ED Notes (Signed)
Patient previously evaluated for the fall on Friday.  Additionally, patient went to urgent care today, and states they sent her here to be further evaluated.  Patient ambulatory into the department without gait disturbance.  ?

## 2022-01-14 NOTE — ED Notes (Signed)
Patient requesting pain medication and something to drink. RN explained that once the provider sees they would be the ones to order her something for pain and that we will wait until provider gives permission for PO fluids in case any imaging is ordered.  ?

## 2022-01-15 ENCOUNTER — Telehealth (HOSPITAL_BASED_OUTPATIENT_CLINIC_OR_DEPARTMENT_OTHER): Payer: Self-pay | Admitting: Emergency Medicine

## 2022-01-15 MED ORDER — ONDANSETRON HCL 4 MG PO TABS: 4.0000 mg | ORAL_TABLET | Freq: Four times a day (QID) | ORAL | 0 refills | Status: AC | PRN

## 2022-01-15 NOTE — Telephone Encounter (Signed)
Sent patient's prescription to requested pharmacy ?

## 2022-01-22 ENCOUNTER — Other Ambulatory Visit: Payer: Self-pay

## 2022-01-22 ENCOUNTER — Emergency Department (HOSPITAL_BASED_OUTPATIENT_CLINIC_OR_DEPARTMENT_OTHER)
Admission: EM | Admit: 2022-01-22 | Discharge: 2022-01-23 | Disposition: A | Discharge status: Home / Self Care | Payer: Managed Care, Other (non HMO) | Attending: Emergency Medicine | Admitting: Emergency Medicine

## 2022-01-22 ENCOUNTER — Emergency Department (HOSPITAL_COMMUNITY): Payer: Managed Care, Other (non HMO)

## 2022-01-22 ENCOUNTER — Encounter (HOSPITAL_BASED_OUTPATIENT_CLINIC_OR_DEPARTMENT_OTHER): Payer: Self-pay | Admitting: Emergency Medicine

## 2022-01-22 DIAGNOSIS — M7601 Gluteal tendinitis, right hip: Secondary | ICD-10-CM | POA: Insufficient documentation

## 2022-01-22 DIAGNOSIS — R202 Paresthesia of skin: Secondary | ICD-10-CM | POA: Insufficient documentation

## 2022-01-22 DIAGNOSIS — W010XXA Fall on same level from slipping, tripping and stumbling without subsequent striking against object, initial encounter: Secondary | ICD-10-CM | POA: Diagnosis not present

## 2022-01-22 DIAGNOSIS — R140 Abdominal distension (gaseous): Secondary | ICD-10-CM | POA: Diagnosis not present

## 2022-01-22 DIAGNOSIS — R110 Nausea: Secondary | ICD-10-CM | POA: Diagnosis not present

## 2022-01-22 DIAGNOSIS — R200 Anesthesia of skin: Secondary | ICD-10-CM | POA: Insufficient documentation

## 2022-01-22 DIAGNOSIS — M5442 Lumbago with sciatica, left side: Secondary | ICD-10-CM | POA: Diagnosis not present

## 2022-01-22 DIAGNOSIS — R1084 Generalized abdominal pain: Secondary | ICD-10-CM | POA: Diagnosis not present

## 2022-01-22 DIAGNOSIS — S3210XD Unspecified fracture of sacrum, subsequent encounter for fracture with routine healing: Secondary | ICD-10-CM

## 2022-01-22 DIAGNOSIS — K6289 Other specified diseases of anus and rectum: Secondary | ICD-10-CM | POA: Diagnosis not present

## 2022-01-22 DIAGNOSIS — M7602 Gluteal tendinitis, left hip: Secondary | ICD-10-CM | POA: Diagnosis not present

## 2022-01-22 DIAGNOSIS — M545 Low back pain, unspecified: Secondary | ICD-10-CM | POA: Diagnosis present

## 2022-01-22 LAB — URINALYSIS, ROUTINE W REFLEX MICROSCOPIC
Bilirubin Urine: NEGATIVE
Glucose, UA: NEGATIVE mg/dL
Hgb urine dipstick: NEGATIVE
Ketones, ur: NEGATIVE mg/dL
Leukocytes,Ua: NEGATIVE
Nitrite: NEGATIVE
Protein, ur: NEGATIVE mg/dL
Specific Gravity, Urine: 1.015 (ref 1.005–1.030)
pH: 7 (ref 5.0–8.0)

## 2022-01-22 IMAGING — MR MR LUMBAR SPINE W/O CM
4 of 5 series · 26 of 48 positions shown · non-contrast
Comparison: Prior CT from [DATE] and MRI from [DATE].

CLINICAL DATA: Initial evaluation for acute trauma, lumbar
radiculopathy.

EXAM:
MRI LUMBAR SPINE WITHOUT CONTRAST
TECHNIQUE: Multiplanar, multisequence MR imaging of the lumbar spine was
performed. No intravenous contrast was administered.

[Series 9: T2 · sagittal · 4.0mm · 0.73mm/px · 6 of 16 slices shown (1 of 2)]
[im 1/16]
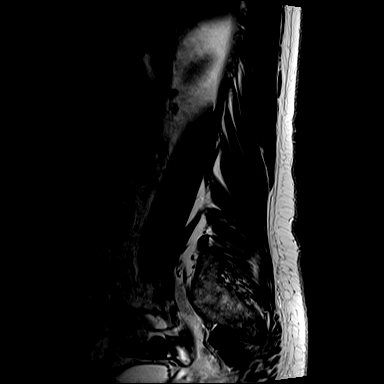
[im 4/16]
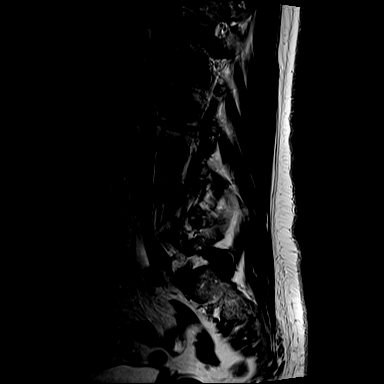
[im 7/16]
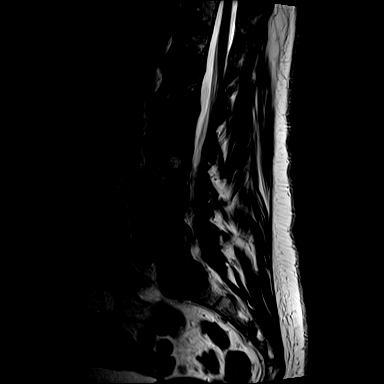
[im 10/16]
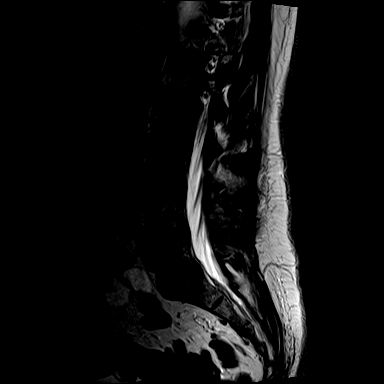
[im 13/16]
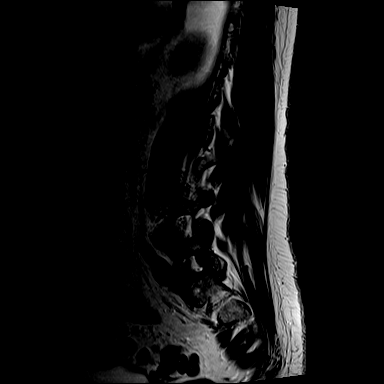
[im 16/16]
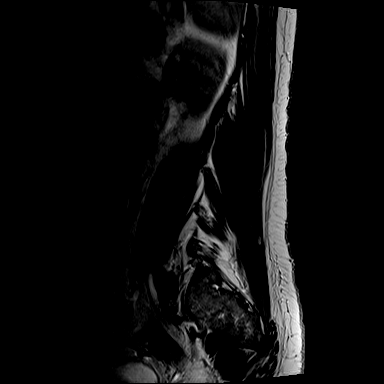

[Series 11: T1 · sagittal · 4.0mm · 0.88mm/px · 7 of 16 slices shown (1 of 2)]
[im 1/16]
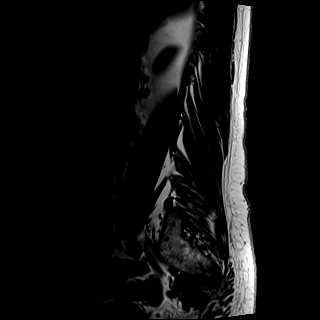
[im 3/16]
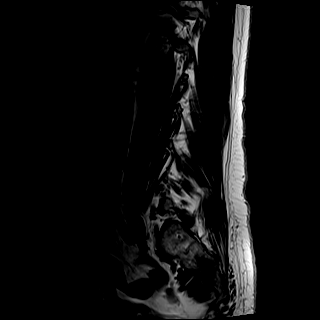
[im 6/16]
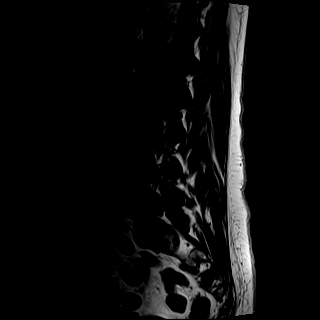
[im 8/16]
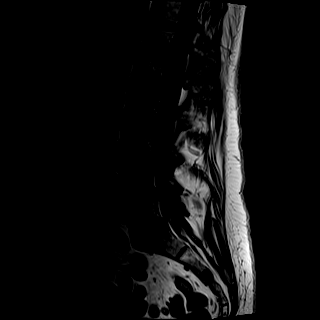
[im 11/16]
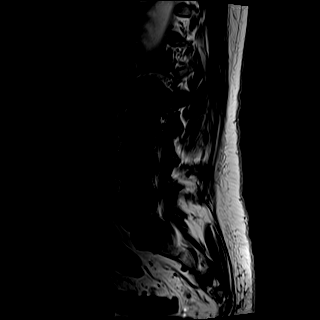
[im 13/16]
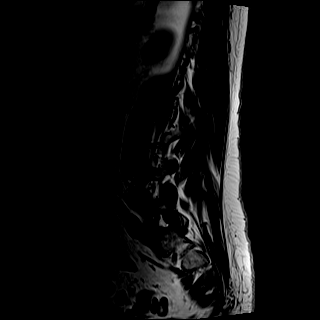
[im 16/16]
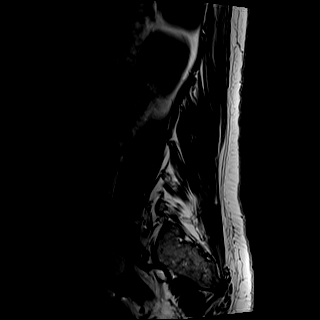

[Series 12: T2 · axial · 4.0mm · 0.57mm/px · z∈[-237,-46]mm · 8 of 35 slices shown (2 of 2)]
[im 1/35]
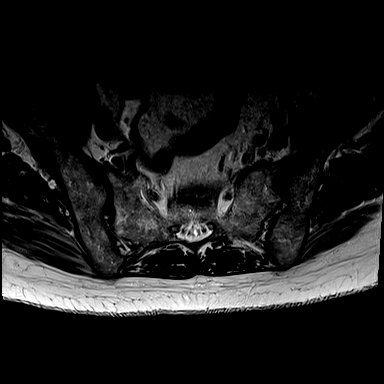
[im 6/35]
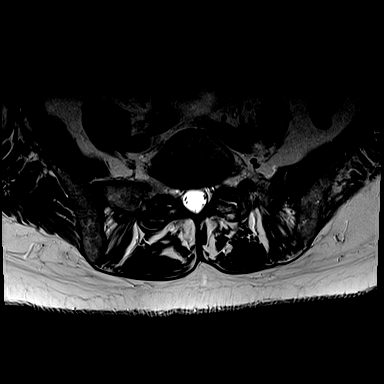
[im 11/35]
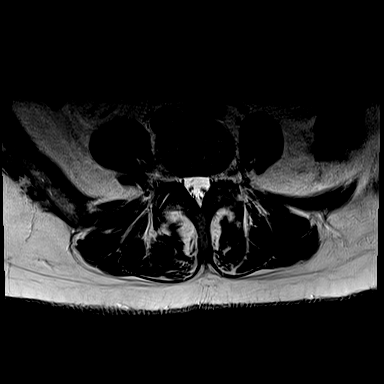
[im 16/35]
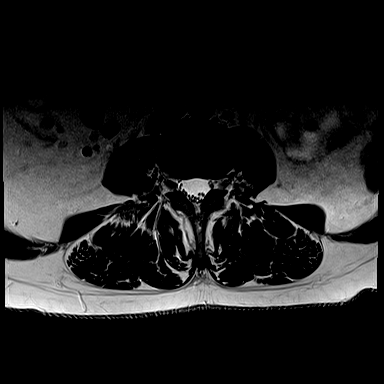
[im 19/35]
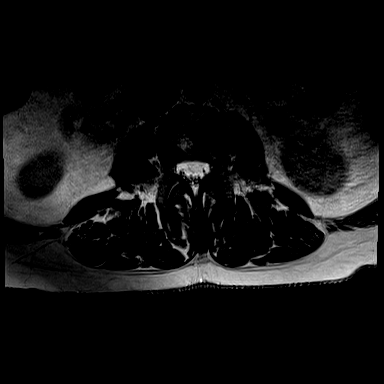
[im 24/35]
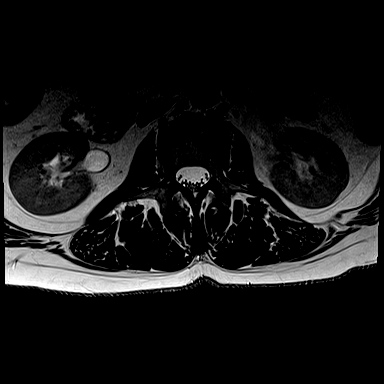
[im 29/35]
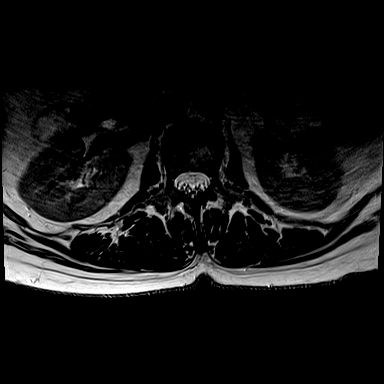
[im 35/35]
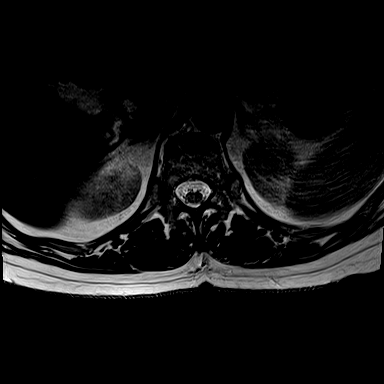

[Series 13: T1 · axial · 4.0mm · 0.34mm/px · z∈[-237,-76]mm · 5 of 35 slices shown (2 of 2)]
[im 1/35]
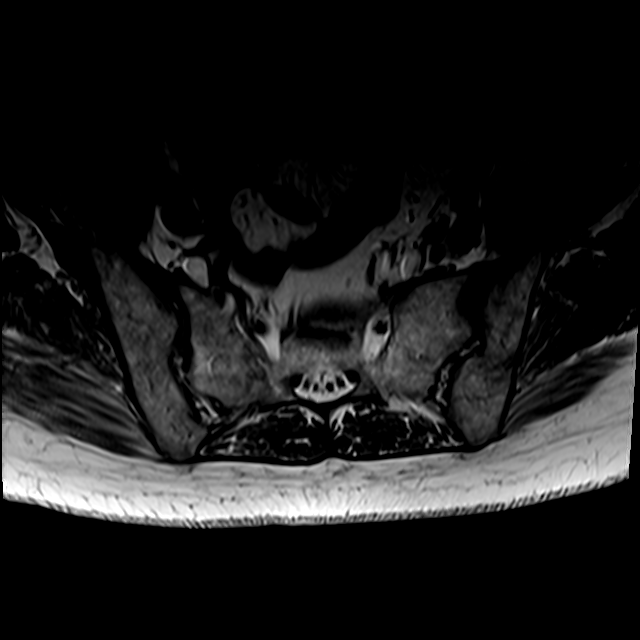
[im 6/35]
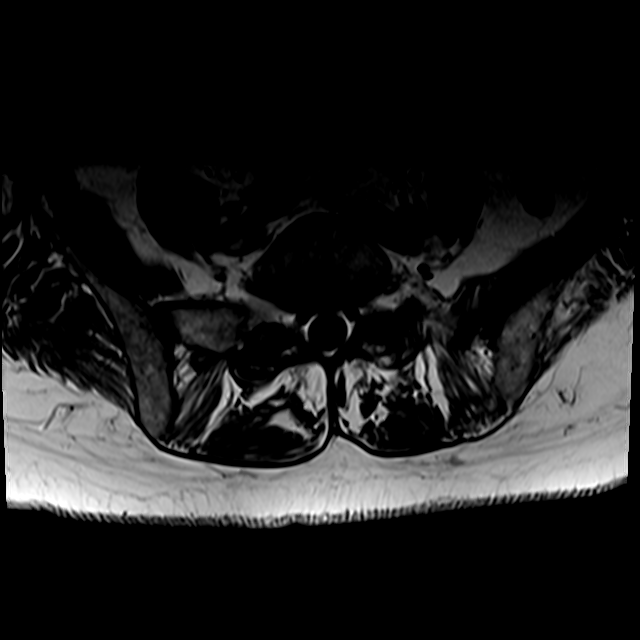
[im 11/35]
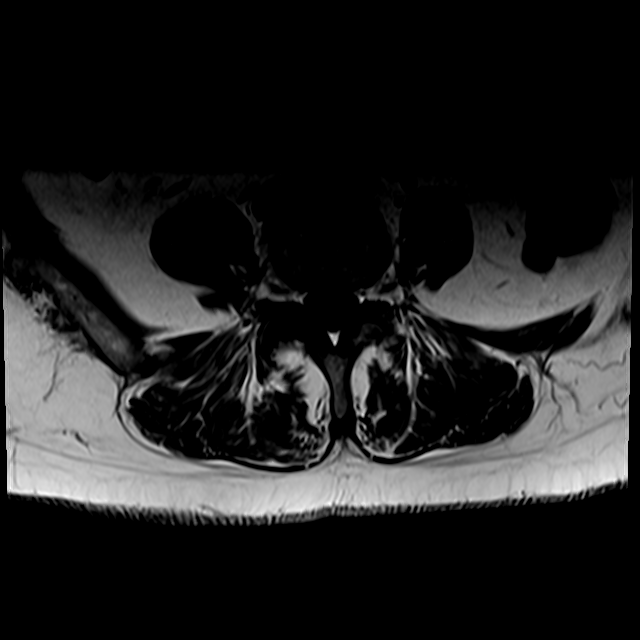
[im 19/35]
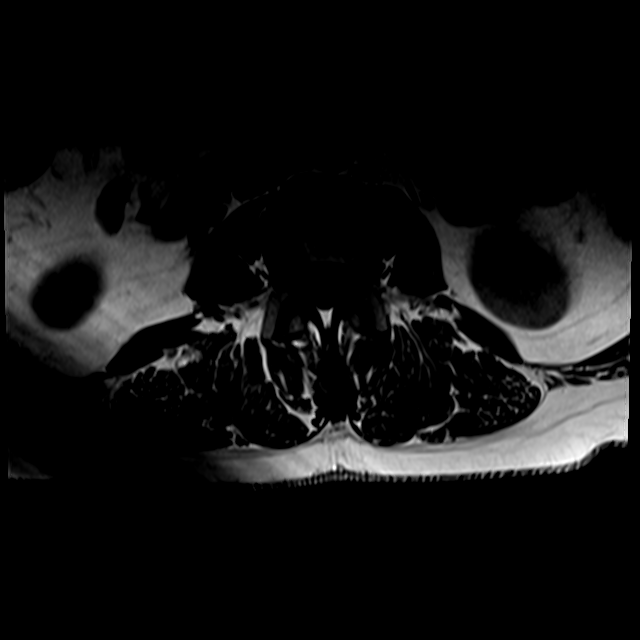
[im 29/35]
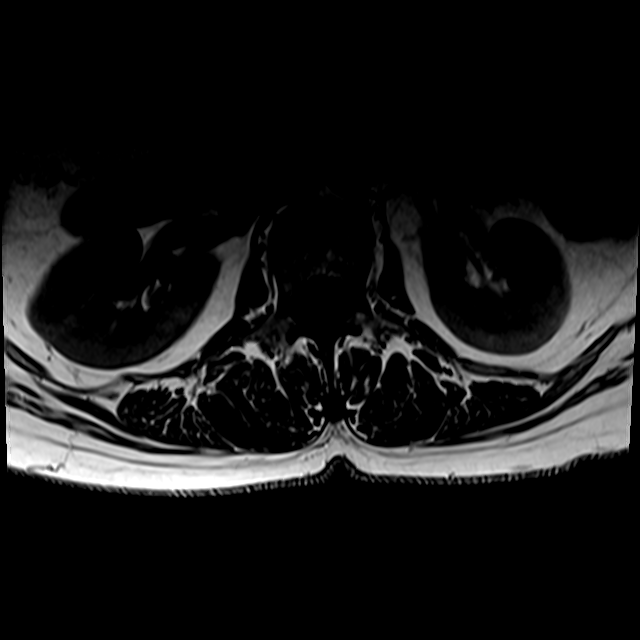

[26 of 48 positions shown; findings below may reference images not displayed]

FINDINGS: Segmentation: Standard. Lowest well-formed disc space labeled the
L5-S1 level.

Alignment: Trace degenerative anterolisthesis of L5 on S1. Alignment
otherwise normal with preservation of the normal lumbar lordosis.

Vertebrae: Abnormal marrow edema seen involving the S3 segment,
consistent with in acute/recent sacral fracture, corresponding with
abnormality on prior CT. No other acute fracture within the lumbar
spine. Vertebral body height maintained. Bone marrow signal
intensity normal. Few scattered benign hemangiomata noted. No
worrisome osseous lesions.

Conus medullaris and cauda equina: Conus extends to the L1 level.
Conus and cauda equina appear normal.

Paraspinal and other soft tissues: Mild soft tissue edema adjacent
to the acute sacral fracture. Paraspinous soft tissues demonstrate
no other acute finding. Subcentimeter simple cyst noted within the
lower left kidney, benign in appearance, no follow-up imaging
recommended.

Disc levels:

L1-2: Minimal annular disc bulge and facet hypertrophy. No stenosis.

L2-3: Negative interspace. Minimal facet hypertrophy. No stenosis.

L3-4: Negative interspace. Minimal facet hypertrophy. No stenosis.

L4-5: Mild disc bulge. Superimposed small right foraminal disc
protrusion (series 12, image 26). Moderate right with mild left
facet hypertrophy with trace joint effusions. No significant spinal
stenosis. Mild right L4 foraminal narrowing.

L5-S1: Minimal disc bulge. Moderate bilateral facet hypertrophy with
associated trace joint effusions. No significant canal or foraminal
stenosis.
IMPRESSION: 1. Acute/recent sacral fracture, corresponding with abnormality on
recent CT.
2. No other acute traumatic injury within the lumbar spine.
3. Mild disc bulge with small right foraminal disc protrusion at
L4-5 with resultant mild right L4 foraminal stenosis.
4. Moderate facet hypertrophy at L4-5 and L5-S1, which could
contribute to lower back pain.

## 2022-01-22 MED ORDER — LORAZEPAM 1 MG PO TABS
1.0000 mg | ORAL_TABLET | Freq: Once | ORAL | Status: AC
  Administered 2022-01-22: 1 mg via ORAL
  Filled 2022-01-22: qty 1

## 2022-01-22 NOTE — ED Notes (Signed)
244 mls in bladder ?

## 2022-01-22 NOTE — ED Notes (Signed)
Pt transferred to [facility] ER to ER for MR lumbar spine.  Report called to Delia Chimes, charge.   ?

## 2022-01-22 NOTE — ED Provider Notes (Signed)
Patient transferred from [facility] for MRI of the lumbar spine and care assumed from [facility].  Please see his note for full details, but in brief patient had a fall on 4/28 and has been evaluated multiple times since then due to ongoing back and abdominal pain.  Sent to ED by PCP today for MRI of the lumbar spine.  ? ?Neurologic exam today is overall reassuring, patient does have weak rectal tone but is unclear if this is chronic.  Patient transferred to [facility] for MRI lumbar spine. ? ?BP 120/74   Pulse 65   Temp 98 ?F (36.7 ?C) (Oral)   Resp 16   Ht 5' (1.524 m)   Wt 65.8 kg   SpO2 94%   BMI 28.32 kg/m?  ? ?Physical Exam ?Procedures  ?Procedures ? ?ED Course / MDM  ?MR LUMBAR SPINE WO CONTRAST ? ?Result Date: 01/22/2022 ?CLINICAL DATA:  Initial evaluation for acute trauma, lumbar radiculopathy. EXAM: MRI LUMBAR SPINE WITHOUT CONTRAST TECHNIQUE: Multiplanar, multisequence MR imaging of the lumbar spine was performed. No intravenous contrast was administered. COMPARISON:  Prior CT from 01/14/2022 and MRI from 01/07/2020. FINDINGS: Segmentation: Standard. Lowest well-formed disc space labeled the L5-S1 level. Alignment: Trace degenerative anterolisthesis of L5 on S1. Alignment otherwise normal with preservation of the normal lumbar lordosis. Vertebrae: Abnormal marrow edema seen involving the S3 segment, consistent with in acute/recent sacral fracture, corresponding with abnormality on prior CT. No other acute fracture within the lumbar spine. Vertebral body height maintained. Bone marrow signal intensity normal. Few scattered benign hemangiomata noted. No worrisome osseous lesions. Conus medullaris and cauda equina: Conus extends to the L1 level. Conus and cauda equina appear normal. Paraspinal and other soft tissues: Mild soft tissue edema adjacent to the acute sacral fracture. Paraspinous soft tissues demonstrate no other acute finding. Subcentimeter simple cyst noted within the lower  left kidney, benign in appearance, no follow-up imaging recommended. Disc levels: L1-2: Minimal annular disc bulge and facet hypertrophy. No stenosis. L2-3: Negative interspace. Minimal facet hypertrophy. No stenosis. L3-4: Negative interspace. Minimal facet hypertrophy. No stenosis. L4-5: Mild disc bulge. Superimposed small right foraminal disc protrusion (series 12, image 26). Moderate right with mild left facet hypertrophy with trace joint effusions. No significant spinal stenosis. Mild right L4 foraminal narrowing. L5-S1: Minimal disc bulge. Moderate bilateral facet hypertrophy with associated trace joint effusions. No significant canal or foraminal stenosis. IMPRESSION: 1. Acute/recent sacral fracture, corresponding with abnormality on recent CT. 2. No other acute traumatic injury within the lumbar spine. 3. Mild disc bulge with small right foraminal disc protrusion at L4-5 with resultant mild right L4 foraminal stenosis. 4. Moderate facet hypertrophy at L4-5 and L5-S1, which could contribute to lower back pain. Electronically Signed   By: Rise Mu M.D.   On: 01/22/2022 23:34    ? ?Medical Decision Making ?Amount and/or Complexity of Data Reviewed ?Labs: ordered. ?Radiology: ordered. ? ? ?I have personally visualized and interpreted imaging, sacral fracture that was seen on CT noted on MRI, no other acute traumatic injuries.  There is a mild disc bulge at L4-5. ? ?Discussed overall reassuring imaging with patient, she plans to follow-up with spine specialist that she was referred to by her PCP I have also given her information for follow-up with [facility]. ? ? ? ? ?  ?Dartha Lodge, PA-C ?01/23/22 7026 ? ?  ?Tilden Fossa, MD ?01/23/22 [phone] ? ?

## 2022-01-22 NOTE — ED Provider Notes (Signed)
?[facility] EMERGENCY DEPARTMENT ?Provider Note ? ? ?CSN: [id number] ?Arrival date & time: 01/22/22  1420 ? ?  ? ?History ? ?No chief complaint on file. ? ? ?Donna [name] is a 56 y.o. female. ? ?Patient with remote history of hip surgery and incontinence surgery -- presents to the emergency department for evaluation of back pain and abdominal pain.  Symptoms started on 4/28 after she slipped and fell on some liquid.  Patient was transported to [facility] ED by ambulance.  There she was complaining of lower back and buttocks pain.  She had plain films of the lumbar spine and left hip and pelvis.  These were negative for acute findings.  Patient was discharged to home on Flexeril and Norco.  She followed up with orthopedics on 5/1.  She was started on Mobic and Medrol Dosepak for 7 days at that time for femoral acetabular impingement.  Patient followed up with PCP office the following day.  At that time she reported lower abdominal pain and epigastric pain for about 3 days, starting after the fall.  She was referred to the emergency department where she was seen here.  She had a CT of the abdomen and pelvis at that time which was negative for acute findings and also lumbar spine evaluation with CT showing question of nondisplaced sacral fracture on sagittal imaging only.  Patient then again followed up with her PCP on 5/5.  She went back today for follow-up.  She continues to complain of abdominal pain and back pain.  While there she states that they did a rectal exam and sent her to the emergency department for MRI because she did not have rectal tone. ? ?Today patient complains of generalized abdominal pain and bloating.  She continues to have pain in her lower back and into the left hip.  In addition she reports intermittent numbness in specific areas of her legs.  For instance last evening, she had numbness in her right thigh area that resolved.  She has had some numbness at times in her left leg as  well.  She states that she has been constipated since the fall, but has been taking prescribed pain medication occasionally.  She states that she cannot take this at bedtime and work the next day.  She continues to work but has been difficult due to pain.  Recently she has had nausea but no vomiting.  She denies fecal incontinence or urinary incontinence.  She feels that she can only urinate a little bit at a time.  She denies no saddle paresthesias.  No recent unexplained fevers or weight loss.  No history of cancer. ? ? ? ? ?  ? ?Home Medications ?Prior to Admission medications   ?Medication Sig Start Date End Date Taking? Authorizing Provider  ?acetaminophen (TYLENOL) 500 MG tablet Take 1,000 mg by mouth as needed for headache.    [provider]  ?celecoxib (CELEBREX) 100 MG capsule Take 1 capsule (100 mg total) by mouth 2 (two) times daily. 05/10/20   Hyatt, Max T, DPM  ?DULoxetine (CYMBALTA) 60 MG capsule Take 1 capsule (60 mg total) by mouth at bedtime. 05/04/20   Lovorn, Aundra Millet, MD  ?meloxicam (MOBIC) 15 MG tablet Take 1 tablet (15 mg total) by mouth daily. 01/26/20   Hyatt, Max T, DPM  ?methocarbamol (ROBAXIN) 500 MG tablet Take 1 tablet (500 mg total) by mouth 2 (two) times daily. 01/14/22   Tanda Rockers, PA-C  ?Omeprazole-Sodium Bicarbonate (ZEGERID PO) Take by  mouth as needed.    [provider]  ?ondansetron (ZOFRAN) 4 MG tablet Take 1 tablet (4 mg total) by mouth every 6 (six) hours as needed for nausea or vomiting. 01/15/22   Sloan LeiterGray, Samuel A, DO  ?oxyCODONE-acetaminophen (PERCOCET/ROXICET) 5-325 MG tablet Take 1 tablet by mouth every 6 (six) hours as needed for severe pain. 01/14/22   Tanda RockersVenter, Margaux, PA-C  ?traMADol (ULTRAM) 50 MG tablet Take 1 tablet (50 mg total) by mouth every 6 (six) hours as needed. 05/04/20   Genice RougeLovorn, Megan, MD  ?   ? ?Allergies    ?Patient has no known allergies.   ? ?Review of Systems   ?Review of Systems ? ?Physical Exam ?Updated Vital Signs ?BP (!) 148/84   Pulse  74   Temp 98 ?F (36.7 ?C) (Oral)   Resp 18   Ht 5' (1.524 m)   Wt 65.8 kg   SpO2 98%   BMI 28.32 kg/m?  ? ?Physical Exam ?Vitals and nursing note reviewed. Exam conducted with a chaperone present [employer] manager(RN Sophie).  ?Constitutional:   ?   Appearance: She is well-developed.  ?HENT:  ?   Head: Normocephalic and atraumatic.  ?   Mouth/Throat:  ?   Mouth: Mucous membranes are moist.  ?Eyes:  ?   Conjunctiva/sclera: Conjunctivae normal.  ?Pulmonary:  ?   Effort: Pulmonary effort is normal.  ?Abdominal:  ?   Palpations: Abdomen is soft.  ?   Tenderness: There is abdominal tenderness. There is no guarding or rebound.  ?   Comments: Patient with diffuse tenderness of the abdomen without focality.  Patient moves around in the bed without guarding.    ?Genitourinary: ?   Exam position: Knee-chest position.  ?   Rectum: External hemorrhoid present. No mass or tenderness. Abnormal anal tone (present, but weak, unclear if this is chronic).  ?   Comments: No stool in rectal vault. Patient can feel my finger touching in saddle area.  ?Musculoskeletal:     ?   General: Normal range of motion.  ?   Cervical back: Normal range of motion and neck supple.  ?   Comments: No step-off noted with palpation of spine.   ?Skin: ?   General: Skin is warm and dry.  ?   Findings: No rash.  ?Neurological:  ?   Mental Status: She is alert.  ?   Sensory: No sensory deficit.  ?   Motor: Motor function is intact.  ?   Deep Tendon Reflexes: Reflexes are normal and symmetric.  ?   Comments: 5/5 strength in entire lower extremities bilaterally. No sensation deficit. No hyperreflexia noted.   ? ? ?ED Results / Procedures / Treatments   ?Labs ?(all labs ordered are listed, but only abnormal results are displayed) ?Labs Reviewed  ?URINALYSIS, ROUTINE W REFLEX MICROSCOPIC  ? ? ?EKG ?None ? ?Radiology ?No results found. ? ?Procedures ?Procedures  ? ? ?Medications Ordered in ED ?Medications - No data to display ? ?ED Course/ Medical Decision Making/ A&P ?   ? ?Patient seen and examined. History obtained directly from patient. Extensive review of recent external ED, PCP and ortho notes and imaging.    ? ?Labs/EKG: UA negative ? ?Imaging: None ordered ? ?Medications/Fluids: None ordered ? ?Most recent vital signs reviewed and are as follows: ?BP (!) 148/84   Pulse 74   Temp 98 ?F (36.7 ?C) (Oral)   Resp 18   Ht 5' (1.524 m)   Wt 65.8 kg   SpO2  98%   BMI 28.32 kg/m?  ? ?Initial impression: Back pain, abdominal pain after recent fall.  Question of nondisplaced sacral fracture on recent imaging otherwise negative work-up. ? ?Pt discussed with Dr. Anitra Lauth.  Based on current exam, I am not overly concerned about cauda equina.  No reported urinary retention, large volume of incontinence which would be consistent with overflow incontinence fecal incontinence, saddle paresthesias.  Had she not been sent in for MRI, I do not think that I would have transported her out to prefer have this performed.  Attempting to obtain additional information.  We will repeat rectal exam and perform postvoid residual. ? ?5:37 PM Rectal performed with RN chaperone. Pt has weak rectal tone, but is present.  ? ?6:03 PM Post-void residual 244cc >> 74cc after voiding.  ? ?6:23 PM Reassessment performed. Patient appears stable. We had a shared decision making decision regarding imaging.  Patient is concerned about her back pain and also about the intermittent numbness symptoms that she has had in her legs.  She would prefer to be transferred to [facility] for MRI to rule out the possibility of cauda equina or other significant injury.  Patient has been driving and does not have any concerns about driving currently. ? ?Most current vital signs reviewed and are as follows: ?BP 125/60   Pulse (!) 59   Temp 98 ?F (36.7 ?C) (Oral)   Resp 18   Ht 5' (1.524 m)   Wt 65.8 kg   SpO2 96%   BMI 28.32 kg/m?  ? ?Plan: Spoke with Dr. Rubin Payor, accepting.  ? ? ?                        ?Medical  Decision Making ?Amount and/or Complexity of Data Reviewed ?Labs: ordered. ? ? ?Patient with continued back pain after a fall. Sent in due to clinical concern for cauda equina. We do not have MRI here today. Overall picture is unclea

## 2022-01-22 NOTE — Discharge Instructions (Addendum)
Your MRI did not show signs of cauda equina syndrome or an acute spinal fracture.  It does see the sacral fracture that was seen on your CT from a few days ago. ? ?You have some mild disc bulging but no spinal cord impingement.  Follow-up with the spine specialist that you were referred to by your primary care doctor or with [facility] as listed on your paperwork today, you can call to schedule follow-up appointment. ?

## 2022-01-22 NOTE — ED Notes (Signed)
Pt seen  by pmd today to evaluate for abdominal bloating and urinary frequency after dx of tailbone fx and pain management with oxycodone.  Pt states no bowel movement for one week,  was not instructed to add stool softener to medication regimen.  Pt reports pmd concerned after possible decrease in rectal sphincter tone and sent to ER for MRI.   ?

## 2022-01-22 NOTE — ED Triage Notes (Signed)
Pt sts she fell 4/28 and was evaluated here; reports she is constipated and sts she is only urinating "a little"; numbness in BLE intermittently; continued back pain and nausea ?

## 2022-01-22 NOTE — ED Notes (Signed)
ED Provider at bedside. 

## 2022-01-22 NOTE — ED Notes (Signed)
Post void ?

## 2023-02-26 ENCOUNTER — Emergency Department (HOSPITAL_BASED_OUTPATIENT_CLINIC_OR_DEPARTMENT_OTHER)
Admission: EM | Admit: 2023-02-26 | Discharge: 2023-02-26 | Disposition: A | Discharge status: Home / Self Care | Payer: Managed Care, Other (non HMO) | Attending: Emergency Medicine | Admitting: Emergency Medicine

## 2023-02-26 ENCOUNTER — Other Ambulatory Visit: Payer: Self-pay

## 2023-02-26 ENCOUNTER — Emergency Department (HOSPITAL_BASED_OUTPATIENT_CLINIC_OR_DEPARTMENT_OTHER): Payer: Managed Care, Other (non HMO)

## 2023-02-26 ENCOUNTER — Encounter (HOSPITAL_BASED_OUTPATIENT_CLINIC_OR_DEPARTMENT_OTHER): Payer: Self-pay | Admitting: Urology

## 2023-02-26 DIAGNOSIS — S93492A Sprain of other ligament of left ankle, initial encounter: Secondary | ICD-10-CM | POA: Insufficient documentation

## 2023-02-26 DIAGNOSIS — S99912A Unspecified injury of left ankle, initial encounter: Secondary | ICD-10-CM | POA: Diagnosis present

## 2023-02-26 DIAGNOSIS — X58XXXA Exposure to other specified factors, initial encounter: Secondary | ICD-10-CM | POA: Insufficient documentation

## 2023-02-26 MED ORDER — OXYCODONE HCL 5 MG PO TABS
10.0000 mg | ORAL_TABLET | Freq: Once | ORAL | Status: AC
  Administered 2023-02-26: 10 mg via ORAL
  Filled 2023-02-26: qty 2

## 2023-02-26 NOTE — ED Provider Notes (Signed)
[facility] Provider Note   CSN: [id number] Arrival date & time: 02/26/23  1944     History Chief Complaint  Patient presents with   Ankle Pain    Donna [name] is a 57 y.o. female patient who presents to the emergency department today for further evaluation of ankle pain that started yesterday.  Pain is localized to the left ankle.  Patient does not recall injuring the ankle but is possible that she might have caused an inversion type injury while performing her activities of daily living yesterday.  Patient is still able to walk on the ankle although it is painful.  She is try to speak with orthopedist but was unable to get appointment until Monday and [place] so she came to the emergency room for further evaluation.   Ankle Pain      Home Medications Prior to Admission medications   Medication Sig Start Date End Date Taking? Authorizing Provider  acetaminophen (TYLENOL) 500 MG tablet Take 1,000 mg by mouth as needed for headache.    [provider]  celecoxib (CELEBREX) 100 MG capsule Take 1 capsule (100 mg total) by mouth 2 (two) times daily. 05/10/20   Hyatt, Max T, DPM  DULoxetine (CYMBALTA) 60 MG capsule Take 1 capsule (60 mg total) by mouth at bedtime. 05/04/20   Lovorn, Aundra Millet, MD  meloxicam (MOBIC) 15 MG tablet Take 1 tablet (15 mg total) by mouth daily. 01/26/20   Hyatt, Max T, DPM  methocarbamol (ROBAXIN) 500 MG tablet Take 1 tablet (500 mg total) by mouth 2 (two) times daily. 01/14/22   Hyman Hopes, Margaux, PA-C  Omeprazole-Sodium Bicarbonate (ZEGERID PO) Take by mouth as needed.    [provider]  ondansetron (ZOFRAN) 4 MG tablet Take 1 tablet (4 mg total) by mouth every 6 (six) hours as needed for nausea or vomiting. 01/15/22   Sloan Leiter, DO  oxyCODONE-acetaminophen (PERCOCET/ROXICET) 5-325 MG tablet Take 1 tablet by mouth every 6 (six) hours as needed for severe pain. 01/14/22   Hyman Hopes, Margaux,  PA-C  traMADol (ULTRAM) 50 MG tablet Take 1 tablet (50 mg total) by mouth every 6 (six) hours as needed. 05/04/20   Genice Rouge, MD      Allergies    Patient has no known allergies.    Review of Systems   Review of Systems  All other systems reviewed and are negative.   Physical Exam Updated Vital Signs BP (!) 152/65 (BP Location: Left Arm)   Pulse 78   Temp 97.9 F (36.6 C) (Oral)   Resp 20   Ht 5' (1.524 m)   Wt 65.9 kg   SpO2 96%   BMI 28.36 kg/m  Physical Exam Vitals and nursing note reviewed.  Constitutional:      Appearance: Normal appearance.  HENT:     Head: Normocephalic and atraumatic.  Eyes:     General:        Right eye: No discharge.        Left eye: No discharge.     Conjunctiva/sclera: Conjunctivae normal.  Pulmonary:     Effort: Pulmonary effort is normal.  Musculoskeletal:     Comments: There is mild swelling to the left lateral and medial malleolus.  There is tenderness over the left ATFL.  Patient does have good range of motion although somewhat limited secondary to pain.  He does have good cap refill distally and good sensation distally.  There is  2+ dorsalis pedis pulse felt in the left foot.  Skin:    General: Skin is warm and dry.     Findings: No rash.  Neurological:     General: No focal deficit present.     Mental Status: She is alert.  Psychiatric:        Mood and Affect: Mood normal.        Behavior: Behavior normal.     ED Results / Procedures / Treatments   Labs (all labs ordered are listed, but only abnormal results are displayed) Labs Reviewed - No data to display  EKG None  Radiology DG Ankle Complete Left  Result Date: 02/26/2023 CLINICAL DATA:  Ankle pain.  No known injury. EXAM: LEFT ANKLE COMPLETE - 3+ VIEW COMPARISON:  Twelve 14 FINDINGS: There is no evidence of fracture, dislocation, or joint effusion. Large plantar calcaneal spur, small Achilles tendon enthesophyte. There is no evidence of arthropathy or other  focal bone abnormality. Mild soft tissue edema. IMPRESSION: 1. No acute osseous findings.  Large plantar calcaneal spur. 2. Mild soft tissue edema. Electronically Signed   By: Narda Rutherford M.D.   On: 02/26/2023 20:16    Procedures Procedures   Medications Ordered in ED Medications  oxyCODONE (Oxy IR/ROXICODONE) immediate release tablet 10 mg (has no administration in time range)    ED Course/ Medical Decision Making/ A&P   {   Click here for ABCD2, HEART and other calculators  Medical Decision Making Tonnetta Bunney Stuebe is a 57 y.o. female patient who presents to the emergency department today for further evaluation of left ankle pain.  Certainly suggestive of left ATFL sprain.  I went over conservative measures with her at the bedside in addition to range of motion exercises.  Ankle x-ray not reveal any significant fractures or malalignment.  I do agree with radiologist interpretation.  Strict return precautions were discussed.  She will follow-up with her orthopedist.  She is safe for discharge at this time.   Amount and/or Complexity of Data Reviewed Radiology: ordered.  Risk Prescription drug management.   Final Clinical Impression(s) / ED Diagnoses Final diagnoses:  Sprain of anterior talofibular ligament of left ankle, initial encounter    Rx / DC Orders ED Discharge Orders     None         Jolyn Lent 02/26/23 2217    Vanetta Mulders, MD 03/02/23 1213

## 2023-02-26 NOTE — ED Notes (Signed)
Pt A&OX4 ambulatory at d/c with independent steady gait. Pt verbalized understanding of d/c instructions and follow up care. Pt reports her friend will be driving her home.

## 2023-02-26 NOTE — ED Triage Notes (Signed)
Pt ambulatory to triage with complaint of left ankle pain and swelling that started last night  No injury noted  Pain with weight bearing

## 2023-02-26 NOTE — Discharge Instructions (Addendum)
As we discussed, you can continue taking Tylenol for pain.  You should use ice 3-4 times per day and switch to heat after the fourth day.  Start doing range of motion exercises of the ankle to ensure that the ligaments return back to the normal position.  You may return to the emergency room if any worsening symptoms.

## 2023-02-26 NOTE — ED Notes (Signed)
Patient transported to X-ray
